# Patient Record
Sex: Female | Born: 1960
Health system: Southern US, Community
[De-identification: ages and names within clinical notes are randomized; demographics above are authoritative.]

## PROBLEM LIST (undated history)

## (undated) DIAGNOSIS — I10 Essential (primary) hypertension: Secondary | ICD-10-CM

## (undated) DIAGNOSIS — F419 Anxiety disorder, unspecified: Secondary | ICD-10-CM

## (undated) DIAGNOSIS — M542 Cervicalgia: Secondary | ICD-10-CM

## (undated) DIAGNOSIS — M5020 Other cervical disc displacement, unspecified cervical region: Secondary | ICD-10-CM

## (undated) HISTORY — PX: LYMPH NODE BIOPSY: SHX201

## (undated) HISTORY — PX: OTHER SURGICAL HISTORY: SHX169

## (undated) HISTORY — DX: Essential (primary) hypertension: I10

## (undated) HISTORY — DX: Cervicalgia: M54.2

## (undated) HISTORY — DX: Other cervical disc displacement, unspecified cervical region: M50.20

## (undated) HISTORY — DX: Anxiety disorder, unspecified: F41.9

---

## 2015-12-01 DIAGNOSIS — I10 Essential (primary) hypertension: Secondary | ICD-10-CM | POA: Insufficient documentation

## 2016-08-10 DIAGNOSIS — Z6821 Body mass index (BMI) 21.0-21.9, adult: Secondary | ICD-10-CM | POA: Diagnosis not present

## 2016-08-10 DIAGNOSIS — Z0000 Encounter for general adult medical examination without abnormal findings: Secondary | ICD-10-CM | POA: Diagnosis not present

## 2016-09-12 DIAGNOSIS — R87810 Cervical high risk human papillomavirus (HPV) DNA test positive: Secondary | ICD-10-CM | POA: Diagnosis not present

## 2017-06-30 DIAGNOSIS — R509 Fever, unspecified: Secondary | ICD-10-CM | POA: Diagnosis not present

## 2017-06-30 DIAGNOSIS — R05 Cough: Secondary | ICD-10-CM | POA: Diagnosis not present

## 2017-06-30 DIAGNOSIS — R6889 Other general symptoms and signs: Secondary | ICD-10-CM | POA: Diagnosis not present

## 2017-07-04 DIAGNOSIS — Z1231 Encounter for screening mammogram for malignant neoplasm of breast: Secondary | ICD-10-CM | POA: Diagnosis not present

## 2017-08-11 DIAGNOSIS — R928 Other abnormal and inconclusive findings on diagnostic imaging of breast: Secondary | ICD-10-CM | POA: Diagnosis not present

## 2017-08-11 DIAGNOSIS — N6489 Other specified disorders of breast: Secondary | ICD-10-CM | POA: Diagnosis not present

## 2017-09-27 DIAGNOSIS — H00021 Hordeolum internum right upper eyelid: Secondary | ICD-10-CM | POA: Diagnosis not present

## 2018-02-23 ENCOUNTER — Ambulatory Visit: Payer: Self-pay | Admitting: Adult Health

## 2018-03-09 ENCOUNTER — Encounter: Payer: Self-pay | Admitting: Adult Health

## 2018-03-09 ENCOUNTER — Ambulatory Visit: Payer: Federal, State, Local not specified - PPO | Admitting: Adult Health

## 2018-03-09 VITALS — BP 152/84 | HR 96 | Resp 16 | Ht 64.5 in | Wt 120.8 lb

## 2018-03-09 DIAGNOSIS — R0602 Shortness of breath: Secondary | ICD-10-CM | POA: Diagnosis not present

## 2018-03-09 DIAGNOSIS — I10 Essential (primary) hypertension: Secondary | ICD-10-CM | POA: Diagnosis not present

## 2018-03-09 DIAGNOSIS — F411 Generalized anxiety disorder: Secondary | ICD-10-CM | POA: Diagnosis not present

## 2018-03-09 NOTE — Progress Notes (Signed)
[facility] [address] [place], [place] [address]  Internal MEDICINE  Office Visit Note  Patient Name: Melody Hicks  [id number]  [id number]  Date of Service: 03/15/2018   Complaints/HPI Pt is here for establishment of PCP. Chief Complaint  Patient presents with  . Hypertension    NEW PATIENT ESTABLISHING CARE   . Anxiety   HPI Pt here to establish care.  She has a history of HTN and anxiety.  She has recently moved to the area.  She had a very bad upper resp infection earlier this year, and her previous doctor told her she may have copd.  She denies Sob, Cough, Doe, or any other symptoms now that her URI has resolved.  She takes amlodipine for her blood pressure, and an aspirin daily.  She had multiple life changes, including deaths in the last 10 months.  Due to this she was diagnosed with anxiety.  She has never taken medications for anxiety, and reports she is doing well now.    Current Medication: Outpatient Encounter Medications as of 03/09/2018  Medication Sig  . amLODipine (NORVASC) 10 MG tablet Take 10 mg by mouth daily.  Marland Kitchen aspirin 81 MG EC tablet Take by mouth.  . Black Currant Seed Oil 500 MG CAPS Take by mouth.  . Calcium-Magnesium-Vitamin D 600-40-500 MG-MG-UNIT TB24 Take by mouth.  . DIGESTIVE ENZYMES PO Take by mouth.  . Omega-3 Fatty Acids (FISH OIL) 1000 MG CAPS Take by mouth.   No facility-administered encounter medications on file as of 03/09/2018.    Surgical History: Past Surgical History:  Procedure Laterality Date  . NEVER     Medical History: Past Medical History:  Diagnosis Date  . Anxiety   . Hypertension    Family History: Family History  Problem Relation Age of Onset  . Diabetes Mother   . Stroke Mother   . Hypertension Mother   . Heart disease Mother   . Hypertension Father   . Stroke Father     Social History   Socioeconomic History  . Marital status: Single    Spouse name: Not on file  . Number of children: Not  on file  . Years of education: Not on file  . Highest education level: Not on file  Occupational History  . Not on file  Social Needs  . Financial resource strain: Not on file  . Food insecurity:    Worry: Not on file    Inability: Not on file  . Transportation needs:    Medical: Not on file    Non-medical: Not on file  Tobacco Use  . Smoking status: Former Games developer  . Smokeless tobacco: Never Used  Substance and Sexual Activity  . Alcohol use: Yes  . Drug use: Never  . Sexual activity: Not on file  Lifestyle  . Physical activity:    Days per week: Not on file    Minutes per session: Not on file  . Stress: Not on file  Relationships  . Social connections:    Talks on phone: Not on file    Gets together: Not on file    Attends religious service: Not on file    Active member of club or organization: Not on file    Attends meetings of clubs or organizations: Not on file    Relationship status: Not on file  . Intimate partner violence:    Fear of current or ex partner: Not on file    Emotionally abused: Not on file  Physically abused: Not on file    Forced sexual activity: Not on file  Other Topics Concern  . Not on file  Social History Narrative  . Not on file   Review of Systems  Constitutional: Negative for chills, fatigue and unexpected weight change.  HENT: Negative for congestion, rhinorrhea, sneezing and sore throat.   Eyes: Negative for photophobia, pain and redness.  Respiratory: Negative for cough, chest tightness and shortness of breath.   Cardiovascular: Negative for chest pain and palpitations.  Gastrointestinal: Negative for abdominal pain, constipation, diarrhea, nausea and vomiting.  Endocrine: Negative.   Genitourinary: Negative for dysuria and frequency.  Musculoskeletal: Negative for arthralgias, back pain, joint swelling and neck pain.  Skin: Negative for rash.  Allergic/Immunologic: Negative.   Neurological: Negative for tremors and numbness.   Hematological: Negative for adenopathy. Does not bruise/bleed easily.  Psychiatric/Behavioral: Negative for behavioral problems and sleep disturbance. The patient is not nervous/anxious.    Vital Signs: BP (!) 152/84   Pulse 96   Resp 16   Ht 5' 4.5" (1.638 m)   Wt 120 lb 12.8 oz (54.8 kg)   SpO2 98%   BMI 20.42 kg/m    Physical Exam  Constitutional: She is oriented to person, place, and time. She appears well-developed and well-nourished. No distress.  HENT:  Head: Normocephalic and atraumatic.  Mouth/Throat: Oropharynx is clear and moist. No oropharyngeal exudate.  Eyes: Pupils are equal, round, and reactive to light. EOM are normal.  Neck: Normal range of motion. Neck supple. No JVD present. No tracheal deviation present. No thyromegaly present.  Cardiovascular: Normal rate, regular rhythm and normal heart sounds. Exam reveals no gallop and no friction rub.  No murmur heard. Pulmonary/Chest: Effort normal and breath sounds normal. No respiratory distress. She has no wheezes. She has no rales. She exhibits no tenderness.  Abdominal: Soft. There is no tenderness. There is no guarding.  Musculoskeletal: Normal range of motion.  Lymphadenopathy:    She has no cervical adenopathy.  Neurological: She is alert and oriented to person, place, and time. No cranial nerve deficit.  Skin: Skin is warm and dry. She is not diaphoretic.  Psychiatric: She has a normal mood and affect. Her behavior is normal. Judgment and thought content normal.  Nursing note and vitals reviewed.  Assessment/Plan: 1. Hypertension, unspecified type Continue to take amlodipine as directed. BP is slightly elevated today Will increase meds prn   2. GAD (generalized anxiety disorder) On no medications, controlled at this time.   3. Shortness of breath - Pulmonary Function Test; Future  General Counseling: Rashell verbalizes understanding of the findings of todays visit and agrees with plan of treatment. I  have discussed any further diagnostic evaluation that may be needed or ordered today. We also reviewed her medications today. she has been encouraged to call the office with any questions or concerns that should arise related to todays visit.  Orders Placed This Encounter  Procedures  . Pulmonary Function Test   Time spent: 25 Minutes  This patient was seen by Blima LedgerAdam Mattia Liford AGNP-C in Collaboration with Dr Lyndon CodeFozia M Khan as a part of collaborative care agreement

## 2018-03-09 NOTE — Patient Instructions (Signed)

## 2018-03-21 ENCOUNTER — Ambulatory Visit: Payer: Federal, State, Local not specified - PPO | Admitting: Internal Medicine

## 2018-03-21 DIAGNOSIS — R0602 Shortness of breath: Secondary | ICD-10-CM

## 2018-03-21 LAB — PULMONARY FUNCTION TEST

## 2018-03-22 ENCOUNTER — Encounter: Payer: Self-pay | Admitting: Obstetrics & Gynecology

## 2018-03-22 ENCOUNTER — Other Ambulatory Visit: Payer: Self-pay

## 2018-03-22 ENCOUNTER — Ambulatory Visit (INDEPENDENT_AMBULATORY_CARE_PROVIDER_SITE_OTHER): Payer: Federal, State, Local not specified - PPO | Admitting: Obstetrics & Gynecology

## 2018-03-22 VITALS — BP 127/75 | HR 88 | Ht 64.0 in | Wt 121.0 lb

## 2018-03-22 DIAGNOSIS — Z113 Encounter for screening for infections with a predominantly sexual mode of transmission: Secondary | ICD-10-CM | POA: Diagnosis not present

## 2018-03-22 DIAGNOSIS — Z01419 Encounter for gynecological examination (general) (routine) without abnormal findings: Secondary | ICD-10-CM | POA: Diagnosis not present

## 2018-03-22 DIAGNOSIS — Z1151 Encounter for screening for human papillomavirus (HPV): Secondary | ICD-10-CM

## 2018-03-22 DIAGNOSIS — Z124 Encounter for screening for malignant neoplasm of cervix: Secondary | ICD-10-CM | POA: Diagnosis not present

## 2018-03-22 NOTE — Patient Instructions (Addendum)
Thank you for enrolling in [place]. Please follow the instructions below to securely access your online medical record. MyChart allows you to send messages to your doctor, view your test results, manage appointments, and more.   How Do I Sign Up? 1. In your Internet browser, go to [employer] and enter [email or web address]. 2. Click on the Sign Up Now link in the Sign In box. You will see the New Member Sign Up page. 3. Enter your MyChart Access Code exactly as it appears below. You will not need to use this code after you've completed the sign-up process. If you do not sign up before the expiration date, you must request a new code.  MyChart Access Code: [email or web address] Expires: 05/06/2018  3:27 PM  4. Enter your Social Security Number ([id number]) and Date of Birth (mm/dd/yyyy) as indicated and click Submit. You will be taken to the next sign-up page. 5. Create a MyChart ID. This will be your MyChart login ID and cannot be changed, so think of one that is secure and easy to remember. 6. Create a MyChart password. You can change your password at any time. 7. Enter your Password Reset Question and Answer. This can be used at a later time if you forget your password.  8. Enter your e-mail address. You will receive e-mail notification when new information is available in [place]. 9. Click Sign Up. You can now view your medical record.   Additional Information Remember, MyChart is NOT to be used for urgent needs. For medical emergencies, dial 911.     Preventive Care 40-64 Years, Female Preventive care refers to lifestyle choices and visits with your health care provider that can promote health and wellness. What does preventive care include?  A yearly physical exam. This is also called an annual well check.  Dental exams once or twice a year.  Routine eye exams. Ask your health care provider how often you should have your eyes checked.  Personal lifestyle choices,  including: ? Daily care of your teeth and gums. ? Regular physical activity. ? Eating a healthy diet. ? Avoiding tobacco and drug use. ? Limiting alcohol use. ? Practicing safe sex. ? Taking low-dose aspirin daily starting at age 71. ? Taking vitamin and mineral supplements as recommended by your health care provider. What happens during an annual well check? The services and screenings done by your health care provider during your annual well check will depend on your age, overall health, lifestyle risk factors, and family history of disease. Counseling Your health care provider may ask you questions about your:  Alcohol use.  Tobacco use.  Drug use.  Emotional well-being.  Home and relationship well-being.  Sexual activity.  Eating habits.  Work and work Statistician.  Method of birth control.  Menstrual cycle.  Pregnancy history.  Screening You may have the following tests or measurements:  Height, weight, and BMI.  Blood pressure.  Lipid and cholesterol levels. These may be checked every 5 years, or more frequently if you are over 2 years old.  Skin check.  Lung cancer screening. You may have this screening every year starting at age 45 if you have a 30-pack-year history of smoking and currently smoke or have quit within the past 15 years.  Fecal occult blood test (FOBT) of the stool. You may have this test every year starting at age 51.  Flexible sigmoidoscopy or colonoscopy. You may have a sigmoidoscopy every 5 years or a colonoscopy every 10 years starting at age  50.  Hepatitis C blood test.  Hepatitis B blood test.  Sexually transmitted disease (STD) testing.  Diabetes screening. This is done by checking your blood sugar (glucose) after you have not eaten for a while (fasting). You may have this done every 1-3 years.  Mammogram. This may be done every 1-2 years. Talk to your health care provider about when you should start having regular  mammograms. This may depend on whether you have a family history of breast cancer.  BRCA-related cancer screening. This may be done if you have a family history of breast, ovarian, tubal, or peritoneal cancers.  Pelvic exam and Pap test. This may be done every 3 years starting at age 72. Starting at age 53, this may be done every 5 years if you have a Pap test in combination with an HPV test.  Bone density scan. This is done to screen for osteoporosis. You may have this scan if you are at high risk for osteoporosis.  Discuss your test results, treatment options, and if necessary, the need for more tests with your health care provider. Vaccines Your health care provider may recommend certain vaccines, such as:  Influenza vaccine. This is recommended every year.  Tetanus, diphtheria, and acellular pertussis (Tdap, Td) vaccine. You may need a Td booster every 10 years.  Varicella vaccine. You may need this if you have not been vaccinated.  Zoster vaccine. You may need this after age 32.  Measles, mumps, and rubella (MMR) vaccine. You may need at least one dose of MMR if you were born in 1957 or later. You may also need a second dose.  Pneumococcal 13-valent conjugate (PCV13) vaccine. You may need this if you have certain conditions and were not previously vaccinated.  Pneumococcal polysaccharide (PPSV23) vaccine. You may need one or two doses if you smoke cigarettes or if you have certain conditions.  Meningococcal vaccine. You may need this if you have certain conditions.  Hepatitis A vaccine. You may need this if you have certain conditions or if you travel or work in places where you may be exposed to hepatitis A.  Hepatitis B vaccine. You may need this if you have certain conditions or if you travel or work in places where you may be exposed to hepatitis B.  Haemophilus influenzae type b (Hib) vaccine. You may need this if you have certain conditions.  Talk to your health care  provider about which screenings and vaccines you need and how often you need them. This information is not intended to replace advice given to you by your health care provider. Make sure you discuss any questions you have with your health care provider. Document Released: 07/03/2015 Document Revised: 02/24/2016 Document Reviewed: 04/07/2015 Elsevier Interactive Patient Education  [employer].

## 2018-03-22 NOTE — Progress Notes (Signed)
GYNECOLOGY ANNUAL PREVENTATIVE CARE ENCOUNTER NOTE  Subjective:   Melody Hicks is a 57 y.o. PMP  female here for a routine annual gynecologic exam.  Patient gets PCP care at [facility], had her pap there last year.  She just moved to the area and wanted to establish care.  No current gynecologic complaints.   Denies abnormal vaginal bleeding, discharge, pelvic pain, problems with intercourse or other gynecologic concerns.    Gynecologic History No LMP recorded. Patient is postmenopausal. Last Pap: 08/10/2016. Results were: normal with positive HPV (negative 16/18/45) Last mammogram: 08/04/2017. Results were: normal  Obstetric History OB History  Gravida Para Term Preterm AB Living  2 1 1   1 1   SAB TAB Ectopic Multiple Live Births  1       1    # Outcome Date GA Lbr Len/2nd Weight Sex Delivery Anes PTL Lv  2 SAB 2001          1 Term 45    M    LIV    Past Medical History:  Diagnosis Date  . Anxiety   . Hypertension   . Neck pain     Past Surgical History:  Procedure Laterality Date  . NEVER      Current Outpatient Medications on File Prior to Visit  Medication Sig Dispense Refill  . amLODipine (NORVASC) 10 MG tablet Take 10 mg by mouth daily.  3  . aspirin 81 MG EC tablet Take by mouth.    . Black Currant Seed Oil 500 MG CAPS Take by mouth.    . Calcium-Magnesium-Vitamin D 600-40-500 MG-MG-UNIT TB24 Take by mouth.    . DIGESTIVE ENZYMES PO Take by mouth.    . Omega-3 Fatty Acids (FISH OIL) 1000 MG CAPS Take by mouth.     No current facility-administered medications on file prior to visit.     Allergies  Allergen Reactions  . Latex Itching  . Lisinopril     Other reaction(s): Hypotension  . Iodinated Diagnostic Agents Nausea And Vomiting    Social History:  reports that she has quit smoking. She has never used smokeless tobacco. She reports that she drank alcohol. She reports that she does not use drugs.  Family History  Problem Relation Age of Onset   . Diabetes Mother   . Stroke Mother   . Hypertension Mother   . Heart disease Mother   . Hypertension Father   . Stroke Father     The following portions of the patient's history were reviewed and updated as appropriate: allergies, current medications, past family history, past medical history, past social history, past surgical history and problem list.  Review of Systems Pertinent items noted in HPI and remainder of comprehensive ROS otherwise negative.   Objective:  BP 127/75 (BP Location: Left Arm)   Pulse 88   Ht 5\' 4"  (1.626 m)   Wt 121 lb (54.9 kg)   BMI 20.77 kg/m  CONSTITUTIONAL: Well-developed, well-nourished female in no acute distress.  HENT:  Normocephalic, atraumatic, External right and left ear normal. Oropharynx is clear and moist EYES: Conjunctivae and EOM are normal. Pupils are equal, round, and reactive to light. No scleral icterus.  NECK: Normal range of motion, supple, no masses.  Normal thyroid.  SKIN: Skin is warm and dry. No rash noted. Not diaphoretic. No erythema. No pallor. MUSCULOSKELETAL: Normal range of motion. No tenderness.  No cyanosis, clubbing, or edema.  2+ distal pulses. NEUROLOGIC: Alert and oriented to person, place,  and time. Normal reflexes, muscle tone coordination. No cranial nerve deficit noted. PSYCHIATRIC: Normal mood and affect. Normal behavior. Normal judgment and thought content. CARDIOVASCULAR: Normal heart rate noted, regular rhythm RESPIRATORY: Clear to auscultation bilaterally. Effort and breath sounds normal, no problems with respiration noted. BREASTS: Symmetric in size. No masses, skin changes, nipple drainage, or lymphadenopathy. ABDOMEN: Soft, normal bowel sounds, no distention noted.  No tenderness, rebound or guarding.  PELVIC: Normal appearing external genitalia with mild-moderate atrophy; normal appearing vaginal mucosa and cervix.  No abnormal discharge noted.  Pap smear obtained, mild bleeding after pap.  Normal uterine  size, no other palpable masses, no uterine or adnexal tenderness.  Assessment and Plan:  Encounter for gynecological examination with Papanicolaou smear of cervix - Cytology - PAP - Hepatitis B surface antigen - Hepatitis C antibody - RPR - HIV Antibody (routine testing w rflx) Will follow up results of pap smear and manage accordingly. Mammogram is up to date Routine preventative health maintenance measures emphasized. Please refer to After Visit Summary for other counseling recommendations.    Jaynie Collins, MD, FACOG Obstetrician & Gynecologist, [facility] for [employer], [facility]

## 2018-03-23 LAB — CYTOLOGY - PAP
CHLAMYDIA, DNA PROBE: NEGATIVE
DIAGNOSIS: NEGATIVE
HPV: NOT DETECTED
Neisseria Gonorrhea: NEGATIVE
Trichomonas: NEGATIVE

## 2018-03-23 LAB — HEPATITIS B SURFACE ANTIGEN: Hepatitis B Surface Ag: NEGATIVE

## 2018-03-23 LAB — RPR: RPR Ser Ql: NONREACTIVE

## 2018-03-23 LAB — HIV ANTIBODY (ROUTINE TESTING W REFLEX): HIV Screen 4th Generation wRfx: NONREACTIVE

## 2018-03-23 LAB — HEPATITIS C ANTIBODY: Hep C Virus Ab: <0.1 s/co ratio (ref 0.0–0.9)

## 2018-03-28 ENCOUNTER — Encounter: Payer: Self-pay | Admitting: Radiology

## 2018-04-05 NOTE — Procedures (Signed)
[facility] [address] [place], [address]  DATE OF SERVICE: March 21, 2018  Complete Pulmonary Function Testing Interpretation:  FINDINGS:  The forced vital capacity is normal.  The FEV1 is 2.44 L which is within normal limits.  Postbronchodilator there is no significant change in FEV1.  The FEV1 FVC ratio is decreased.  Total lung capacity is normal.  Residual volume is within normal limits.  Residual volume total lung capacity ratio is increased.  The FRC is within normal limits.  DLCO is within normal limits.  IMPRESSION:  This pulmonary function study is within normal limits.  DLCO is normal.  Clinical correlation recommended  Yevonne Pax, MD [facility] Pulmonary Critical Care Medicine Sleep Medicine

## 2018-07-15 ENCOUNTER — Encounter: Payer: Self-pay | Admitting: Nurse Practitioner

## 2018-07-16 NOTE — Telephone Encounter (Signed)
Hey. This came to me for some reason.

## 2018-07-27 DIAGNOSIS — L700 Acne vulgaris: Secondary | ICD-10-CM | POA: Diagnosis not present

## 2018-07-27 DIAGNOSIS — L249 Irritant contact dermatitis, unspecified cause: Secondary | ICD-10-CM | POA: Diagnosis not present

## 2018-07-31 DIAGNOSIS — Z23 Encounter for immunization: Secondary | ICD-10-CM | POA: Diagnosis not present

## 2018-08-09 ENCOUNTER — Encounter: Payer: Self-pay | Admitting: Adult Health

## 2018-08-09 ENCOUNTER — Encounter: Payer: Self-pay | Admitting: Nurse Practitioner

## 2018-09-13 ENCOUNTER — Encounter: Payer: Self-pay | Admitting: Nurse Practitioner

## 2018-09-13 ENCOUNTER — Ambulatory Visit (INDEPENDENT_AMBULATORY_CARE_PROVIDER_SITE_OTHER): Payer: Federal, State, Local not specified - PPO | Admitting: Nurse Practitioner

## 2018-09-13 ENCOUNTER — Other Ambulatory Visit: Payer: Self-pay

## 2018-09-13 VITALS — BP 138/78 | HR 85 | Resp 16 | Ht 64.0 in | Wt 122.0 lb

## 2018-09-13 DIAGNOSIS — M25511 Pain in right shoulder: Secondary | ICD-10-CM | POA: Diagnosis not present

## 2018-09-13 DIAGNOSIS — Z0001 Encounter for general adult medical examination with abnormal findings: Secondary | ICD-10-CM

## 2018-09-13 DIAGNOSIS — Z1239 Encounter for other screening for malignant neoplasm of breast: Secondary | ICD-10-CM | POA: Diagnosis not present

## 2018-09-13 DIAGNOSIS — I10 Essential (primary) hypertension: Secondary | ICD-10-CM | POA: Diagnosis not present

## 2018-09-13 DIAGNOSIS — R300 Dysuria: Secondary | ICD-10-CM

## 2018-09-13 NOTE — Progress Notes (Signed)
[facility] [address] [place], [place] [address]  Internal MEDICINE  Office Visit Note  Patient Name: Melody Hicks  [id number]  [id number]  Date of Service: 09/24/2018   Pt is here for routine health maintenance examination   Chief Complaint  Patient presents with  . Annual Exam    pt had pulmonary test done on 10/2 and did not get the results. pt had HEP A shot in arm due to going to visit Melody Hicks, Melody Hicks, her arm has not been normal since, has had pain since taking the shot, pt is not having any relief when touching the flesh,been going on since Feb., pt wants to know if its possible if she can get tested for HEP A  . Hypertension  . Anxiety     Right shoulder pain. Started after getting hepatitis A vaccine on 07/31/2018. Hurts along lateral aspect of the humerus bone. Reduced ROM and strength. Cannot put right arm behind her back. Pulls and makes this motion impossible. Admits she does carry a heavy pocketbook. She knows this does not help with pain and inflammation. Her last pap smear was 03/2018 and was normal. She is due to have screening mammogram.    Current Medication: Outpatient Encounter Medications as of 09/13/2018  Medication Sig  . amLODipine (NORVASC) 10 MG tablet Take 10 mg by mouth daily.  Marland Kitchen aspirin 81 MG EC tablet Take by mouth.  . Black Currant Seed Oil 500 MG CAPS Take by mouth.  . Calcium-Magnesium-Vitamin D 600-40-500 MG-MG-UNIT TB24 Take by mouth.  . DIGESTIVE ENZYMES PO Take by mouth.  . Omega-3 Fatty Acids (FISH OIL) 1000 MG CAPS Take by mouth.   No facility-administered encounter medications on file as of 09/13/2018.     Surgical History: Past Surgical History:  Procedure Laterality Date  . NEVER      Medical History: Past Medical History:  Diagnosis Date  . Anxiety   . Hypertension   . Neck pain     Family History: Family History  Problem Relation Age of Onset  . Diabetes Mother   . Stroke Mother   . Hypertension  Mother   . Heart disease Mother   . Hypertension Father   . Stroke Father       Review of Systems  Constitutional: Negative for chills, fatigue and unexpected weight change.  HENT: Negative for congestion, postnasal drip, rhinorrhea, sneezing and sore throat.   Respiratory: Negative for cough, chest tightness, shortness of breath and wheezing.   Cardiovascular: Negative for chest pain and palpitations.  Gastrointestinal: Negative for abdominal pain, constipation, diarrhea, nausea and vomiting.  Endocrine: Negative for cold intolerance, polydipsia and polyuria.  Genitourinary: Negative for dysuria and frequency.  Musculoskeletal: Positive for arthralgias. Negative for back pain, joint swelling and neck pain.       Right shoulder pain with weakness and reduced ROM.   Skin: Negative for rash.  Neurological: Negative.  Negative for tremors and numbness.  Hematological: Negative for adenopathy. Does not bruise/bleed easily.  Psychiatric/Behavioral: Negative for behavioral problems (Depression), sleep disturbance and suicidal ideas. The patient is not nervous/anxious.      Today's Vitals   09/13/18 1545  BP: 138/78  Pulse: 85  Resp: 16  SpO2: 98%  Weight: 122 lb (55.3 kg)  Height:  (1.626 m)   Body mass index is 20.94 kg/m.  Physical Exam Vitals signs and nursing note reviewed.  Constitutional:      General: She is not in acute distress.  Appearance: Normal appearance. She is well-developed. She is not diaphoretic.  HENT:     Head: Normocephalic and atraumatic.     Mouth/Throat:     Pharynx: No oropharyngeal exudate.  Eyes:     Pupils: Pupils are equal, round, and reactive to light.  Neck:     Musculoskeletal: Normal range of motion and neck supple.     Thyroid: No thyromegaly.     Vascular: No JVD.     Trachea: No tracheal deviation.  Cardiovascular:     Rate and Rhythm: Normal rate and regular rhythm.     Heart sounds: Normal heart sounds. No murmur. No  friction rub. No gallop.   Pulmonary:     Effort: Pulmonary effort is normal. No respiratory distress.     Breath sounds: No wheezing or rales.  Chest:     Chest wall: No tenderness.     Breasts:        Right: Normal. No swelling, bleeding, inverted nipple, mass, nipple discharge, skin change or tenderness.        Left: Normal. No swelling, bleeding, inverted nipple, mass, nipple discharge, skin change or tenderness.  Abdominal:     General: Bowel sounds are normal.     Palpations: Abdomen is soft.  Musculoskeletal: Normal range of motion.     Comments: Moderate right shoulder pain. More significant with moderate palpation or right humerus. No crepitus or bony abnormalities are noted today. ROM and strength minimally diminished.   Lymphadenopathy:     Cervical: No cervical adenopathy.  Skin:    General: Skin is warm and dry.  Neurological:     Mental Status: She is alert and oriented to person, place, and time.     Cranial Nerves: No cranial nerve deficit.  Psychiatric:        Behavior: Behavior normal.        Thought Content: Thought content normal.        Judgment: Judgment normal.   Assessment/Plan: 1. Encounter for general adult medical examination with abnormal findings Annual health maintenance exam today. Routine, fasting labs ordered   2. Hypertension, unspecified type Stable. Continue bp medication as prescribed   3. Right shoulder pain, unspecified chronicity Will get x-ray of right shoulder for further evaluation. Refer to orthopedics as indicated.  - DG Shoulder Right; Future  4. Screening for breast cancer - MM 3D SCREEN BREAST BILATERAL; Future  5. Dysuria - UA/M w/rflx Culture, Routine  General Counseling: Melody Hicks verbalizes understanding of the findings of todays visit and agrees with plan of treatment. I have discussed any further diagnostic evaluation that may be needed or ordered today. We also reviewed her medications today. she has been encouraged to  call the office with any questions or concerns that should arise related to todays visit.    Counseling:  Hypertension Counseling:   The following hypertensive lifestyle modification were recommended and discussed:  1. Limiting alcohol intake to less than 1 oz/day of ethanol:(24 oz of beer or 8 oz of wine or 2 oz of 100-proof whiskey). 2. Take baby ASA 81 mg daily. 3. Importance of regular aerobic exercise and losing weight. 4. Reduce dietary saturated fat and cholesterol intake for overall cardiovascular health. 5. Maintaining adequate dietary potassium, calcium, and magnesium intake. 6. Regular monitoring of the blood pressure. 7. Reduce sodium intake to less than 100 mmol/day (less than 2.3 gm of sodium or less than 6 gm of sodium choride)   This patient was seen by Vincent Gros FNP Collaboration  with Dr Lyndon Code as a part of collaborative care agreement  Orders Placed This Encounter  Procedures  . Microscopic Examination  . Urine Culture, Reflex  . DG Shoulder Right  . MM 3D SCREEN BREAST BILATERAL  . UA/M w/rflx Culture, Routine      Time spent: 45 Minutes      Lyndon Code, MD  Internal Medicine

## 2018-09-14 ENCOUNTER — Other Ambulatory Visit: Payer: Self-pay | Admitting: Nurse Practitioner

## 2018-09-14 ENCOUNTER — Ambulatory Visit
Admission: RE | Admit: 2018-09-14 | Discharge: 2018-09-14 | Disposition: A | Discharge status: Home / Self Care | Payer: Federal, State, Local not specified - PPO | Source: Ambulatory Visit | Attending: Nurse Practitioner | Admitting: Nurse Practitioner

## 2018-09-14 ENCOUNTER — Other Ambulatory Visit: Payer: Self-pay

## 2018-09-14 DIAGNOSIS — E559 Vitamin D deficiency, unspecified: Secondary | ICD-10-CM | POA: Diagnosis not present

## 2018-09-14 DIAGNOSIS — M25511 Pain in right shoulder: Secondary | ICD-10-CM

## 2018-09-14 DIAGNOSIS — I10 Essential (primary) hypertension: Secondary | ICD-10-CM | POA: Diagnosis not present

## 2018-09-14 DIAGNOSIS — Z0001 Encounter for general adult medical examination with abnormal findings: Secondary | ICD-10-CM | POA: Diagnosis not present

## 2018-09-15 LAB — CBC
Hematocrit: 35 % (ref 34.0–46.6)
Hemoglobin: 12 g/dL (ref 11.1–15.9)
MCH: 26.8 pg (ref 26.6–33.0)
MCHC: 34.3 g/dL (ref 31.5–35.7)
MCV: 78 fL — ABNORMAL LOW (ref 79–97)
Platelets: 326 10*3/uL (ref 150–450)
RBC: 4.47 x10E6/uL (ref 3.77–5.28)
RDW: 13.2 % (ref 11.7–15.4)
WBC: 5.3 10*3/uL (ref 3.4–10.8)

## 2018-09-15 LAB — UA/M W/RFLX CULTURE, ROUTINE
Bilirubin, UA: NEGATIVE
GLUCOSE, UA: NEGATIVE
Ketones, UA: NEGATIVE
NITRITE UA: NEGATIVE
PH UA: 7 (ref 5.0–7.5)
PROTEIN UA: NEGATIVE
RBC, UA: NEGATIVE
SPEC GRAV UA: 1.014 (ref 1.005–1.030)
Urobilinogen, Ur: 0.2 mg/dL (ref 0.2–1.0)

## 2018-09-15 LAB — COMPREHENSIVE METABOLIC PANEL
ALBUMIN: 4.6 g/dL (ref 3.8–4.9)
ALT: 9 IU/L (ref 0–32)
AST: 13 IU/L (ref 0–40)
Albumin/Globulin Ratio: 1.5 (ref 1.2–2.2)
Alkaline Phosphatase: 57 IU/L (ref 39–117)
BUN/Creatinine Ratio: 25 (ref 12–28)
BUN: 17 mg/dL (ref 8–27)
Bilirubin Total: <0.2 mg/dL (ref 0.0–1.2)
CO2: 24 mmol/L (ref 20–29)
Calcium: 9.7 mg/dL (ref 8.7–10.3)
Chloride: 102 mmol/L (ref 96–106)
Creatinine, Ser: 0.67 mg/dL (ref 0.57–1.00)
GFR calc Af Amer: 110 mL/min/{1.73_m2} (ref >59)
GFR calc non Af Amer: 96 mL/min/{1.73_m2} (ref >59)
Globulin, Total: 3 g/dL (ref 1.5–4.5)
Glucose: 84 mg/dL (ref 65–99)
Potassium: 4.6 mmol/L (ref 3.5–5.2)
Sodium: 142 mmol/L (ref 134–144)
Total Protein: 7.6 g/dL (ref 6.0–8.5)

## 2018-09-15 LAB — MICROSCOPIC EXAMINATION
CASTS: NONE SEEN /LPF
RBC: NONE SEEN /hpf (ref 0–2)

## 2018-09-15 LAB — LIPID PANEL W/O CHOL/HDL RATIO
Cholesterol, Total: 161 mg/dL (ref 100–199)
HDL: 57 mg/dL (ref >39)
LDL Calculated: 96 mg/dL (ref 0–99)
Triglycerides: 41 mg/dL (ref 0–149)
VLDL Cholesterol Cal: 8 mg/dL (ref 5–40)

## 2018-09-15 LAB — HEPATITIS A ANTIBODY, TOTAL: hep A Total Ab: POSITIVE — AB

## 2018-09-15 LAB — T3: T3, Total: 146 ng/dL (ref 71–180)

## 2018-09-15 LAB — T4, FREE: Free T4: 1.01 ng/dL (ref 0.82–1.77)

## 2018-09-15 LAB — TSH: TSH: 1.59 u[IU]/mL (ref 0.450–4.500)

## 2018-09-15 LAB — URINE CULTURE, REFLEX

## 2018-09-15 LAB — VITAMIN D 25 HYDROXY (VIT D DEFICIENCY, FRACTURES): Vit D, 25-Hydroxy: 30.5 ng/mL (ref 30.0–100.0)

## 2018-09-17 ENCOUNTER — Telehealth: Payer: Self-pay

## 2018-09-17 NOTE — Telephone Encounter (Signed)
Pt advised xray for shoulder is normal we going setup for MRI for further evaluation and send message to beth for Mri setup

## 2018-09-17 NOTE — Telephone Encounter (Signed)
-----   Message from Carlean Jews, NP sent at 09/14/2018  3:45 PM EDT ----- Please let the patient know that shoulder x-ray is normal. I would like to have her set up for MRI for further evaluation. Thanks.

## 2018-09-17 NOTE — Telephone Encounter (Signed)
-----   Message from Heather E Boscia, NP sent at 09/14/2018  3:45 PM EDT ----- Please let the patient know that shoulder x-ray is normal. I would like to have her set up for MRI for further evaluation. Thanks. 

## 2018-09-20 ENCOUNTER — Encounter: Payer: Self-pay | Admitting: Nurse Practitioner

## 2018-09-21 ENCOUNTER — Other Ambulatory Visit: Payer: Self-pay | Admitting: Nurse Practitioner

## 2018-09-21 DIAGNOSIS — N390 Urinary tract infection, site not specified: Secondary | ICD-10-CM

## 2018-09-21 MED ORDER — NITROFURANTOIN MONOHYD MACRO 100 MG PO CAPS: 100.0000 mg | ORAL_CAPSULE | Freq: Two times a day (BID) | ORAL | 0 refills | Status: DC

## 2018-09-21 NOTE — Progress Notes (Signed)
Urine sample with bacteriuria. Added macrobid 100mg  bid for 5 days. Sent to [facility] [address].

## 2018-09-24 DIAGNOSIS — M25511 Pain in right shoulder: Secondary | ICD-10-CM

## 2018-09-24 DIAGNOSIS — R300 Dysuria: Secondary | ICD-10-CM

## 2018-09-24 DIAGNOSIS — Z1239 Encounter for other screening for malignant neoplasm of breast: Secondary | ICD-10-CM | POA: Insufficient documentation

## 2018-09-24 DIAGNOSIS — Z0001 Encounter for general adult medical examination with abnormal findings: Secondary | ICD-10-CM | POA: Insufficient documentation

## 2018-09-24 HISTORY — DX: Dysuria: R30.0

## 2018-09-24 HISTORY — DX: Pain in right shoulder: M25.511

## 2018-09-26 ENCOUNTER — Other Ambulatory Visit: Payer: Self-pay | Admitting: Nurse Practitioner

## 2018-09-26 DIAGNOSIS — G8929 Other chronic pain: Secondary | ICD-10-CM

## 2018-09-26 DIAGNOSIS — M25511 Pain in right shoulder: Principal | ICD-10-CM

## 2018-09-27 NOTE — Telephone Encounter (Signed)
Hey. Did you forward this to the front to make appointment for this?

## 2018-10-02 ENCOUNTER — Encounter: Payer: Self-pay | Admitting: Nurse Practitioner

## 2018-10-17 ENCOUNTER — Encounter: Payer: Self-pay | Admitting: Nurse Practitioner

## 2018-10-22 ENCOUNTER — Encounter: Payer: Self-pay | Admitting: Internal Medicine

## 2018-10-22 ENCOUNTER — Ambulatory Visit: Payer: Federal, State, Local not specified - PPO | Admitting: Adult Health

## 2018-10-25 ENCOUNTER — Ambulatory Visit
Admission: RE | Admit: 2018-10-25 | Discharge: 2018-10-25 | Disposition: A | Discharge status: Home / Self Care | Payer: Federal, State, Local not specified - PPO | Source: Ambulatory Visit | Attending: Nurse Practitioner | Admitting: Nurse Practitioner

## 2018-10-25 ENCOUNTER — Other Ambulatory Visit: Payer: Self-pay

## 2018-10-25 DIAGNOSIS — G8929 Other chronic pain: Secondary | ICD-10-CM | POA: Insufficient documentation

## 2018-10-25 DIAGNOSIS — M25511 Pain in right shoulder: Secondary | ICD-10-CM | POA: Insufficient documentation

## 2018-10-26 DIAGNOSIS — Z03818 Encounter for observation for suspected exposure to other biological agents ruled out: Secondary | ICD-10-CM | POA: Diagnosis not present

## 2018-10-29 ENCOUNTER — Encounter: Payer: Self-pay | Admitting: Nurse Practitioner

## 2018-10-29 ENCOUNTER — Ambulatory Visit: Payer: Federal, State, Local not specified - PPO

## 2018-10-29 ENCOUNTER — Ambulatory Visit: Payer: Federal, State, Local not specified - PPO | Admitting: Nurse Practitioner

## 2018-10-29 ENCOUNTER — Other Ambulatory Visit: Payer: Self-pay

## 2018-10-29 VITALS — Ht 64.0 in | Wt 120.0 lb

## 2018-10-29 DIAGNOSIS — M25511 Pain in right shoulder: Secondary | ICD-10-CM | POA: Diagnosis not present

## 2018-10-29 MED ORDER — METHYLPREDNISOLONE 4 MG PO TBPK: ORAL_TABLET | ORAL | 0 refills | Status: DC

## 2018-10-29 NOTE — Progress Notes (Signed)
[facility] [address] [place], [place] [address]  Internal MEDICINE  Telephone Visit  Patient Name: Melody Hicks  [id number]  [id number]  Date of Service: 10/29/2018  I connected with the patient at 9:33am by webcam and verified the patients identity using two identifiers.   I discussed the limitations, risks, security and privacy concerns of performing an evaluation and management service by webcam and the availability of in person appointments. I also discussed with the patient that there may be a patient responsible charge related to the service.  The patient expressed understanding and agrees to proceed.    Chief Complaint  Patient presents with  . Telephone Screen    VIDEO VISIT  . Telephone Assessment  . Labs Only    MRI Results    The patient has been contacted via webcam for follow up visit due to concerns for spread of novel coronavirus. continues to have right shoulder pain. Started after getting hepatitis A vaccine on 07/31/2018. Hurts along lateral aspect of the humerus bone. Reduced ROM and strength. Cannot put right arm behind her back. Pulls and makes this motion impossible. Admits she does carry a heavy pocketbook. She knows this does not help with pain and inflammation. Taking ibuprofen for pain does help. ROM of the right arm is still limited due to pain and stiffness. She had MRI of the right shoulder done since her last visit. Shows evidence of capsulitis/bursitis of the shoulder, also has some thinning of the cartilage of the glenohumeral joint.       Current Medication: Outpatient Encounter Medications as of 10/29/2018  Medication Sig  . amLODipine (NORVASC) 10 MG tablet Take 10 mg by mouth daily.  Marland Kitchen aspirin 81 MG EC tablet Take by mouth.  . Black Currant Seed Oil 500 MG CAPS Take by mouth.  . Calcium-Magnesium-Vitamin D 600-40-500 MG-MG-UNIT TB24 Take by mouth.  . DIGESTIVE ENZYMES PO Take by mouth.  . methylPREDNISolone (MEDROL) 4 MG TBPK  tablet Take by mouth as directed for 6 days  . Omega-3 Fatty Acids (FISH OIL) 1000 MG CAPS Take by mouth.  . [DISCONTINUED] nitrofurantoin, macrocrystal-monohydrate, (MACROBID) 100 MG capsule Take 1 capsule (100 mg total) by mouth 2 (two) times daily. (Patient not taking: Reported on 10/29/2018)   No facility-administered encounter medications on file as of 10/29/2018.     Surgical History: Past Surgical History:  Procedure Laterality Date  . NEVER      Medical History: Past Medical History:  Diagnosis Date  . Anxiety   . Hypertension   . Neck pain     Family History: Family History  Problem Relation Age of Onset  . Diabetes Mother   . Stroke Mother   . Hypertension Mother   . Heart disease Mother   . Hypertension Father   . Stroke Father     Social History   Socioeconomic History  . Marital status: Single    Spouse name: Not on file  . Number of children: Not on file  . Years of education: Not on file  . Highest education level: Not on file  Occupational History  . Not on file  Social Needs  . Financial resource strain: Not on file  . Food insecurity:    Worry: Not on file    Inability: Not on file  . Transportation needs:    Medical: Not on file    Non-medical: Not on file  Tobacco Use  . Smoking status: Former Games developer  . Smokeless tobacco: Never  Used  Substance and Sexual Activity  . Alcohol use: Not Currently  . Drug use: Never  . Sexual activity: Not Currently  Lifestyle  . Physical activity:    Days per week: Not on file    Minutes per session: Not on file  . Stress: Not on file  Relationships  . Social connections:    Talks on phone: Not on file    Gets together: Not on file    Attends religious service: Not on file    Active member of club or organization: Not on file    Attends meetings of clubs or organizations: Not on file    Relationship status: Not on file  . Intimate partner violence:    Fear of current or ex partner: Not on file     Emotionally abused: Not on file    Physically abused: Not on file    Forced sexual activity: Not on file  Other Topics Concern  . Not on file  Social History Narrative  . Not on file      Review of Systems  Constitutional: Negative for chills, fatigue and unexpected weight change.  HENT: Negative for congestion, postnasal drip, rhinorrhea, sneezing and sore throat.   Respiratory: Negative for cough, chest tightness, shortness of breath and wheezing.   Cardiovascular: Negative for chest pain and palpitations.  Gastrointestinal: Negative for abdominal pain, constipation, diarrhea, nausea and vomiting.  Endocrine: Negative for cold intolerance, polydipsia and polyuria.  Genitourinary: Negative for dysuria and frequency.  Musculoskeletal: Positive for arthralgias. Negative for back pain, joint swelling and neck pain.       Right shoulder pain with weakness and reduced ROM.   Skin: Negative for rash.  Neurological: Negative for dizziness, tremors, numbness and headaches.  Hematological: Negative for adenopathy. Does not bruise/bleed easily.  Psychiatric/Behavioral: Negative for behavioral problems (Depression), sleep disturbance and suicidal ideas. The patient is not nervous/anxious.     Today's Vitals   10/29/18 0916  Weight: 120 lb (54.4 kg)  Height: 5\' 4"  (1.626 m)   Body mass index is 20.6 kg/m.  Observation/Objective:   The patient is alert and oriented. She is pleasant and answers all questions appropriately. Breathing is non-labored. She is in no acute distress at this time.    Assessment/Plan: 1. Right shoulder pain, unspecified chronicity Reviewed results of MRI with patient. Shows evidence of capsulitis/bursitis of right shoulder. Will try medrol taper. Take as directed for 6 days. Continue ibuprofen as needed and as indicated to reduce pain/inflammation. Refer to orthopedics as indicated . - methylPREDNISolone (MEDROL) 4 MG TBPK tablet; Take by mouth as directed for  6 days  Dispense: 21 tablet; Refill: 0  General Counseling: Kiearra verbalizes understanding of the findings of today's phone visit and agrees with plan of treatment. I have discussed any further diagnostic evaluation that may be needed or ordered today. We also reviewed her medications today. she has been encouraged to call the office with any questions or concerns that should arise related to todays visit.  This patient was seen by Vincent GrosHeather Nathania Waldman FNP Collaboration with Dr Lyndon CodeFozia M Khan as a part of collaborative care agreement  Meds ordered this encounter  Medications  . methylPREDNISolone (MEDROL) 4 MG TBPK tablet    Sig: Take by mouth as directed for 6 days    Dispense:  21 tablet    Refill:  0    Order Specific Question:   Supervising Provider    Answer:   Lyndon CodeKHAN, FOZIA M [[id number]]  Time spent: 37 Minutes    Dr Lavera Guise Internal medicine

## 2018-10-31 ENCOUNTER — Encounter: Payer: Self-pay | Admitting: Internal Medicine

## 2018-10-31 NOTE — Telephone Encounter (Signed)
How do we upload her PFT and results note onto mychart for her?

## 2018-11-01 ENCOUNTER — Telehealth: Payer: Self-pay

## 2018-11-01 NOTE — Telephone Encounter (Signed)
Spoke with pt that we cannot load pft on mychart we can hand her result

## 2018-11-29 ENCOUNTER — Ambulatory Visit
Admission: RE | Admit: 2018-11-29 | Discharge: 2018-11-29 | Disposition: A | Discharge status: Home / Self Care | Payer: Federal, State, Local not specified - PPO | Source: Ambulatory Visit | Attending: Nurse Practitioner | Admitting: Nurse Practitioner

## 2018-11-29 ENCOUNTER — Other Ambulatory Visit: Payer: Self-pay

## 2018-11-29 DIAGNOSIS — Z1231 Encounter for screening mammogram for malignant neoplasm of breast: Secondary | ICD-10-CM | POA: Diagnosis not present

## 2018-11-29 DIAGNOSIS — Z1239 Encounter for other screening for malignant neoplasm of breast: Secondary | ICD-10-CM

## 2018-12-12 ENCOUNTER — Encounter: Payer: Self-pay | Admitting: Nurse Practitioner

## 2019-01-29 ENCOUNTER — Encounter: Payer: Self-pay | Admitting: Radiology

## 2019-02-05 ENCOUNTER — Other Ambulatory Visit: Payer: Self-pay

## 2019-02-05 MED ORDER — AMLODIPINE BESYLATE 10 MG PO TABS: 10.0000 mg | ORAL_TABLET | Freq: Every day | ORAL | 3 refills | Status: DC

## 2019-03-21 ENCOUNTER — Ambulatory Visit: Payer: Federal, State, Local not specified - PPO | Admitting: Nurse Practitioner

## 2019-04-04 ENCOUNTER — Encounter: Payer: Self-pay | Admitting: Obstetrics & Gynecology

## 2019-04-04 ENCOUNTER — Ambulatory Visit (INDEPENDENT_AMBULATORY_CARE_PROVIDER_SITE_OTHER): Payer: Federal, State, Local not specified - PPO | Admitting: Obstetrics & Gynecology

## 2019-04-04 ENCOUNTER — Other Ambulatory Visit: Payer: Self-pay

## 2019-04-04 VITALS — BP 125/72 | HR 89 | Ht 64.0 in | Wt 124.0 lb

## 2019-04-04 DIAGNOSIS — Z1151 Encounter for screening for human papillomavirus (HPV): Secondary | ICD-10-CM

## 2019-04-04 DIAGNOSIS — Z01419 Encounter for gynecological examination (general) (routine) without abnormal findings: Secondary | ICD-10-CM

## 2019-04-04 DIAGNOSIS — Z113 Encounter for screening for infections with a predominantly sexual mode of transmission: Secondary | ICD-10-CM | POA: Diagnosis not present

## 2019-04-04 NOTE — Patient Instructions (Signed)

## 2019-04-04 NOTE — Progress Notes (Signed)
GYNECOLOGY ANNUAL PREVENTATIVE CARE ENCOUNTER NOTE  History:     Melody Hicks is a 58 y.o. G25P1011 female here for a routine annual gynecologic exam.  Current complaints: none.   Denies abnormal vaginal bleeding, discharge, pelvic pain, problems with intercourse or other gynecologic concerns.    Gynecologic History No LMP recorded. Patient is postmenopausal. Contraception: post menopausal status Last Pap: 03/22/2018. Results were: normal with negative HPV Last mammogram: 11/28/2017. Results were: normal  Obstetric History OB History  Gravida Para Term Preterm AB Living  2 1 1   1 1   SAB TAB Ectopic Multiple Live Births  1       1    # Outcome Date GA Lbr Len/2nd Weight Sex Delivery Anes PTL Lv  2 SAB 2001          1 Term 71    M    LIV    Past Medical History:  Diagnosis Date  . Anxiety   . Hypertension   . Neck pain     Past Surgical History:  Procedure Laterality Date  . NEVER      Current Outpatient Medications on File Prior to Visit  Medication Sig Dispense Refill  . amLODipine (NORVASC) 10 MG tablet Take 1 tablet (10 mg total) by mouth daily. 30 tablet 3  . aspirin 81 MG EC tablet Take by mouth.    . Calcium-Magnesium-Vitamin D 355-73-220 MG-MG-UNIT TB24 Take by mouth.    . Omega-3 Fatty Acids (FISH OIL) 1000 MG CAPS Take by mouth.    . Black Currant Seed Oil 500 MG CAPS Take by mouth.    . DIGESTIVE ENZYMES PO Take by mouth.    . methylPREDNISolone (MEDROL) 4 MG TBPK tablet Take by mouth as directed for 6 days (Patient not taking: Reported on 04/04/2019) 21 tablet 0   No current facility-administered medications on file prior to visit.     Allergies  Allergen Reactions  . Latex Itching  . Lisinopril Other (See Comments)    Other reaction(s): Hypotension Low bp,   . Iodinated Diagnostic Agents Nausea And Vomiting    Social History:  reports that she has quit smoking. She has never used smokeless tobacco. She reports previous alcohol use. She  reports that she does not use drugs.  Family History  Problem Relation Age of Onset  . Diabetes Mother   . Stroke Mother   . Hypertension Mother   . Heart disease Mother   . Hypertension Father   . Stroke Father     The following portions of the patient's history were reviewed and updated as appropriate: allergies, current medications, past family history, past medical history, past social history, past surgical history and problem list.  Review of Systems Pertinent items noted in HPI and remainder of comprehensive ROS otherwise negative.  Physical Exam:  BP 125/72   Pulse 89   Ht 5\' 4"  (1.626 m)   Wt 124 lb (56.2 kg)   BMI 21.28 kg/m  CONSTITUTIONAL: Well-developed, well-nourished female in no acute distress.  HENT:  Normocephalic, atraumatic, External right and left ear normal. Oropharynx is clear and moist EYES: Conjunctivae and EOM are normal. Pupils are equal, round, and reactive to light. No scleral icterus.  NECK: Normal range of motion, supple, no masses.  Normal thyroid.  SKIN: Skin is warm and dry. No rash noted. Not diaphoretic. No erythema. No pallor. MUSCULOSKELETAL: Normal range of motion. No tenderness.  No cyanosis, clubbing, or edema.  2+ distal pulses. NEUROLOGIC:  Alert and oriented to person, place, and time. Normal reflexes, muscle tone coordination. No cranial nerve deficit noted. PSYCHIATRIC: Normal mood and affect. Normal behavior. Normal judgment and thought content. CARDIOVASCULAR: Normal heart rate noted, regular rhythm RESPIRATORY: Clear to auscultation bilaterally. Effort and breath sounds normal, no problems with respiration noted. BREASTS: Symmetric in size. No masses, skin changes, nipple drainage, or lymphadenopathy. ABDOMEN: Soft, normal bowel sounds, no distention noted.  No tenderness, rebound or guarding.  PELVIC: Normal appearing external genitalia with mild-moderate atrophy; normal appearing vaginal mucosa and cervix.  No abnormal discharge  noted.  Pap smear obtained, mild bleeding after pap.  Normal uterine size, no other palpable masses, no uterine or adnexal tenderness.    Assessment and Plan:      1. Well woman exam with routine gynecological exam - Ambulatory referral to Family Practice - Cytology - PAP - Hepatitis B surface antigen - Hepatitis C antibody - HIV Antibody (routine testing w rflx) - RPR Will follow up results of pap smear and manage accordingly. Mammogram is up to date Routine preventative health maintenance measures emphasized, referred to PCP. Please refer to After Visit Summary for other counseling recommendations.    Jaynie Collins, MD, FACOG Obstetrician & Gynecologist, [facility] for [employer], [facility]

## 2019-04-05 LAB — HEPATITIS C ANTIBODY: Hep C Virus Ab: <0.1 s/co ratio (ref 0.0–0.9)

## 2019-04-05 LAB — HEPATITIS B SURFACE ANTIGEN: Hepatitis B Surface Ag: NEGATIVE

## 2019-04-05 LAB — RPR: RPR Ser Ql: NONREACTIVE

## 2019-04-05 LAB — HIV ANTIBODY (ROUTINE TESTING W REFLEX): HIV Screen 4th Generation wRfx: NONREACTIVE

## 2019-04-08 ENCOUNTER — Ambulatory Visit: Payer: Federal, State, Local not specified - PPO | Admitting: Nurse Practitioner

## 2019-04-11 LAB — CYTOLOGY - PAP
Chlamydia: NEGATIVE
Comment: NEGATIVE
Comment: NEGATIVE
Comment: NEGATIVE
Comment: NORMAL
Diagnosis: NEGATIVE
High risk HPV: NEGATIVE
Neisseria Gonorrhea: NEGATIVE
Trichomonas: NEGATIVE

## 2019-04-12 ENCOUNTER — Ambulatory Visit: Payer: Federal, State, Local not specified - PPO | Admitting: Family Medicine

## 2019-06-28 ENCOUNTER — Ambulatory Visit (INDEPENDENT_AMBULATORY_CARE_PROVIDER_SITE_OTHER): Payer: Federal, State, Local not specified - PPO | Admitting: Primary Care

## 2019-06-28 ENCOUNTER — Other Ambulatory Visit: Payer: Self-pay

## 2019-06-28 ENCOUNTER — Encounter: Payer: Self-pay | Admitting: Primary Care

## 2019-06-28 DIAGNOSIS — I10 Essential (primary) hypertension: Secondary | ICD-10-CM

## 2019-06-28 MED ORDER — AMLODIPINE BESYLATE 10 MG PO TABS: 10.0000 mg | ORAL_TABLET | Freq: Every day | ORAL | 3 refills | Status: DC

## 2019-06-28 NOTE — Assessment & Plan Note (Signed)
Stable in the office today off of Amlodipine for one month. She has improved her diet, mostly vegan diet now.  Will have her monitor BP at home off of Amlodipine for now and resume Amlodipine if BP increases at or above 130/90. Refill provided. She will update.

## 2019-06-28 NOTE — Patient Instructions (Signed)
Continue to monitor your blood pressure off of Amlodipine. Resume your blood pressure medication if your blood pressure becomes consistently elevated at or above 135/90.  Please schedule a physical with me in April 2021. You may also schedule a lab only appointment 3-4 days prior. We will discuss your lab results in detail during your physical.  It was a pleasure to meet you today! Please don't hesitate to call or message me with any questions. Welcome to [facility]!

## 2019-06-28 NOTE — Progress Notes (Signed)
Subjective:    Patient ID: Melody Hicks, female    DOB: Dec 13, 1960, 59 y.o.   MRN: [id number]  HPI  This visit occurred during the SARS-CoV-2 public health emergency.  Safety protocols were in place, including screening questions prior to the visit, additional usage of staff PPE, and extensive cleaning of exam room while observing appropriate contact time as indicated for disinfecting solutions.   Melody Hicks is a 59 year old female who presents today to establish care and discuss the problems mentioned below. Will obtain/review records.  1) Essential Hypertension: Currently managed on amlodipine 10 mg. She has been out of her Amlodipine for the last one week. She checked her blood pressure last night which was 146/84 without resting. She is needing a refill today.  She denies chest pain, headaches, dizziness, blurred vision.   BP Readings from Last 3 Encounters:  06/28/19 126/82  04/04/19 125/72  09/13/18 138/78     Review of Systems  Eyes: Negative for visual disturbance.  Respiratory: Negative for shortness of breath.   Cardiovascular: Negative for chest pain.  Neurological: Negative for dizziness and headaches.       Past Medical History:  Diagnosis Date  . Anxiety   . Herniated disc, cervical   . Hypertension      Social History   Socioeconomic History  . Marital status: Single    Spouse name: Not on file  . Number of children: Not on file  . Years of education: Not on file  . Highest education level: Not on file  Occupational History  . Not on file  Tobacco Use  . Smoking status: Former Games developer  . Smokeless tobacco: Never Used  Substance and Sexual Activity  . Alcohol use: Not Currently  . Drug use: Never  . Sexual activity: Not Currently  Other Topics Concern  . Not on file  Social History Narrative   Single.   1 son. 1 grandchild.   Working part time.   Social Determinants of Health   Financial Resource Strain:   . Difficulty of Paying Living  Expenses: Not on file  Food Insecurity:   . Worried About Programme researcher, broadcasting/film/video in the Last Year: Not on file  . Ran Out of Food in the Last Year: Not on file  Transportation Needs:   . Lack of Transportation (Medical): Not on file  . Lack of Transportation (Non-Medical): Not on file  Physical Activity:   . Days of Exercise per Week: Not on file  . Minutes of Exercise per Session: Not on file  Stress:   . Feeling of Stress : Not on file  Social Connections:   . Frequency of Communication with Friends and Family: Not on file  . Frequency of Social Gatherings with Friends and Family: Not on file  . Attends Religious Services: Not on file  . Active Member of Clubs or Organizations: Not on file  . Attends Banker Meetings: Not on file  . Marital Status: Not on file  Intimate Partner Violence:   . Fear of Current or Ex-Partner: Not on file  . Emotionally Abused: Not on file  . Physically Abused: Not on file  . Sexually Abused: Not on file    Past Surgical History:  Procedure Laterality Date  . LYMPH NODE BIOPSY      Family History  Problem Relation Age of Onset  . Diabetes Mother   . Stroke Mother   . Hypertension Mother   . Heart disease Mother   .  Hypertension Father   . Stroke Father     Allergies  Allergen Reactions  . Latex Itching  . Lisinopril Other (See Comments)    Other reaction(s): Hypotension Low bp,   . Iodinated Diagnostic Agents Nausea And Vomiting    Current Outpatient Medications on File Prior to Visit  Medication Sig Dispense Refill  . amLODipine (NORVASC) 10 MG tablet Take 1 tablet (10 mg total) by mouth daily. 30 tablet 3  . ascorbic acid (VITAMIN C) 500 MG tablet Take by mouth.    Marland Kitchen aspirin 81 MG EC tablet Take by mouth.    . Black Currant Seed Oil 500 MG CAPS Take by mouth.    . Calcium-Magnesium-Vitamin D [id number] MG-MG-UNIT TB24 Take by mouth.    . DIGESTIVE ENZYMES PO Take by mouth.    . Omega-3 Fatty Acids (FISH OIL) 1000 MG  CAPS Take by mouth.     No current facility-administered medications on file prior to visit.    BP 126/82   Pulse 88   Temp 97.6 F (36.4 C) (Temporal)   Ht 5' 4.75" (1.645 m)   Wt 123 lb 12 oz (56.1 kg)   SpO2 98%   BMI 20.75 kg/m    Objective:   Physical Exam  Constitutional: She appears well-nourished.  Cardiovascular: Normal rate and regular rhythm.  Respiratory: Effort normal and breath sounds normal.  Musculoskeletal:     Cervical back: Neck supple.  Skin: Skin is warm and dry.  Psychiatric: She has a normal mood and affect.           Assessment & Plan:

## 2019-07-14 DIAGNOSIS — Z1152 Encounter for screening for COVID-19: Secondary | ICD-10-CM

## 2019-07-14 DIAGNOSIS — I10 Essential (primary) hypertension: Secondary | ICD-10-CM

## 2019-08-05 ENCOUNTER — Emergency Department
Admission: EM | Admit: 2019-08-05 | Discharge: 2019-08-05 | Disposition: A | Discharge status: Home / Self Care | Payer: Federal, State, Local not specified - PPO | Attending: Emergency Medicine | Admitting: Emergency Medicine

## 2019-08-05 ENCOUNTER — Telehealth: Payer: Self-pay

## 2019-08-05 ENCOUNTER — Other Ambulatory Visit: Payer: Self-pay

## 2019-08-05 DIAGNOSIS — I10 Essential (primary) hypertension: Secondary | ICD-10-CM | POA: Insufficient documentation

## 2019-08-05 DIAGNOSIS — Z91040 Latex allergy status: Secondary | ICD-10-CM | POA: Diagnosis not present

## 2019-08-05 DIAGNOSIS — Z87891 Personal history of nicotine dependence: Secondary | ICD-10-CM | POA: Diagnosis not present

## 2019-08-05 DIAGNOSIS — Z79899 Other long term (current) drug therapy: Secondary | ICD-10-CM | POA: Diagnosis not present

## 2019-08-05 DIAGNOSIS — R220 Localized swelling, mass and lump, head: Secondary | ICD-10-CM | POA: Diagnosis not present

## 2019-08-05 LAB — BASIC METABOLIC PANEL
Anion gap: 8 (ref 5–15)
BUN: 16 mg/dL (ref 6–20)
CO2: 27 mmol/L (ref 22–32)
Calcium: 9.1 mg/dL (ref 8.9–10.3)
Chloride: 105 mmol/L (ref 98–111)
Creatinine, Ser: 0.61 mg/dL (ref 0.44–1.00)
GFR calc Af Amer: >60 mL/min (ref >60)
GFR calc non Af Amer: >60 mL/min (ref >60)
Glucose, Bld: 108 mg/dL — ABNORMAL HIGH (ref 70–99)
Potassium: 3.6 mmol/L (ref 3.5–5.1)
Sodium: 140 mmol/L (ref 135–145)

## 2019-08-05 LAB — CBC
HCT: 35.9 % — ABNORMAL LOW (ref 36.0–46.0)
Hemoglobin: 11.2 g/dL — ABNORMAL LOW (ref 12.0–15.0)
MCH: 26.9 pg (ref 26.0–34.0)
MCHC: 31.2 g/dL (ref 30.0–36.0)
MCV: 86.3 fL (ref 80.0–100.0)
Platelets: 312 10*3/uL (ref 150–400)
RBC: 4.16 MIL/uL (ref 3.87–5.11)
RDW: 13.9 % (ref 11.5–15.5)
WBC: 6.1 10*3/uL (ref 4.0–10.5)
nRBC: 0 % (ref 0.0–0.2)

## 2019-08-05 NOTE — ED Provider Notes (Addendum)
[facility] Emergency Department Provider Note  Time seen: 2:59 AM  I have reviewed the triage vital signs and the nursing notes.   HISTORY  Chief Complaint Hypertension and Facial Swelling   HPI Melody Hicks is a 59 y.o. female with a past medical history of anxiety, hypertension presents to the emergency department for right facial swelling and hypertension.  According to the patient she stated earlier tonight she noticed that it looked like the right side of her face is swollen a little, she states she took her blood pressure and it was over 200.  Patient states she took it for more times and remained elevated so she came to the emergency department.  Patient has a history of hypertension is prescribed amlodipine but states she has not been taking it over the past month or so because her blood pressures have largely been normal at home.  Patient states she checked it last week and it was around 846 systolic.  Patient denies any chest pain no shortness of breath cough or fever.  Largely negative review of systems.  Denies any dental pain or facial pain.   Past Medical History:  Diagnosis Date  . Anxiety   . Herniated disc, cervical   . Hypertension      Past Surgical History:  Procedure Laterality Date  . LYMPH NODE BIOPSY      Prior to Admission medications   Medication Sig Start Date End Date Taking? Authorizing Provider  amLODipine (NORVASC) 10 MG tablet Take 1 tablet (10 mg total) by mouth daily. For blood pressure. 06/28/19   Pleas Koch, NP  ascorbic acid (VITAMIN C) 500 MG tablet Take by mouth.    [provider]  aspirin 81 MG EC tablet Take by mouth.    [provider]  Black Currant Seed Oil 500 MG CAPS Take by mouth.    [provider]  Calcium-Magnesium-Vitamin D [id number] MG-MG-UNIT TB24 Take by mouth.    [provider]  DIGESTIVE ENZYMES PO Take by mouth.    [provider]  Omega-3  Fatty Acids (FISH OIL) 1000 MG CAPS Take by mouth.    [provider]    Allergies  Allergen Reactions  . Latex Itching  . Lisinopril Other (See Comments)    Other reaction(s): Hypotension Low bp,   . Iodinated Diagnostic Agents Nausea And Vomiting    Family History  Problem Relation Age of Onset  . Diabetes Mother   . Stroke Mother   . Hypertension Mother   . Heart disease Mother   . Hypertension Father   . Stroke Father     Social History Social History   Tobacco Use  . Smoking status: Former Research scientist (life sciences)  . Smokeless tobacco: Never Used  Substance Use Topics  . Alcohol use: Not Currently  . Drug use: Never    Review of Systems Constitutional: Negative for fever. ENT: States right facial swelling Cardiovascular: Negative for chest pain. Respiratory: Negative for shortness of breath. Gastrointestinal: Negative for abdominal pain Musculoskeletal: Negative for musculoskeletal complaints Neurological: Negative for headache All other ROS negative  ____________________________________________   PHYSICAL EXAM:  VITAL SIGNS: ED Triage Vitals  Enc Vitals Group     BP 08/05/19 0228 (!) 186/93     Pulse Rate 08/05/19 0228 (!) 109     Resp 08/05/19 0228 18     Temp 08/05/19 0228 98.5 F (36.9 C)     Temp Source 08/05/19 0228 Oral  SpO2 08/05/19 0228 100 %     Weight 08/05/19 0229 124 lb (56.2 kg)     Height 08/05/19 0229 5\' 4"  (1.626 m)     Head Circumference --      Peak Flow --      Pain Score --      Pain Loc --      Pain Edu? --      Excl. in GC? --    Constitutional: Alert and oriented. Well appearing and in no distress. Eyes: Normal exam ENT      Head: Normocephalic and atraumatic.  No swelling noted.      Mouth/Throat: Mucous membranes are moist.  Normal exam without swelling identified.  No dental pain identified. Cardiovascular: Normal rate, regular rhythm. No murmur Respiratory: Normal respiratory effort without tachypnea nor retractions.  Breath sounds are clear  Gastrointestinal: Soft and nontender. No distention.   Musculoskeletal: Nontender with normal range of motion in all extremities. Neurologic:  Normal speech and language. No gross focal neurologic deficits  Skin:  Skin is warm, dry and intact.  Psychiatric: Mood and affect are normal.   INITIAL IMPRESSION / ASSESSMENT AND PLAN / ED COURSE  Pertinent labs & imaging results that were available during my care of the patient were reviewed by me and considered in my medical decision making (see chart for details).   Patient presents emergency department for hypertension and facial swelling earlier.  No appreciable facial swelling at this time.  Patient's blood pressure is 186/93, rechecked in the room was 195 systolic.  Patient states she did take 10 mg of amlodipine just prior to arrival.  We will check basic labs, EKG and continue to closely monitor.  Overall the patient appears well, no distress reassuring physical exam.  Lab work is largely nonrevealing. EKG viewed and interpreted by myself shows sinus tachycardia 106 bpm, narrow QRS, normal axis, normal intervals, no concerning ST changes.  [name] was evaluated in Emergency Department on 08/05/2019 for the symptoms described in the history of present illness. She was evaluated in the context of the global COVID-19 pandemic, which necessitated consideration that the patient might be at risk for infection with the SARS-CoV-2 virus that causes COVID-19. Institutional protocols and algorithms that pertain to the evaluation of patients at risk for COVID-19 are in a state of rapid change based on information released by regulatory bodies including the CDC and federal and state organizations. These policies and algorithms were followed during the patient's care in the ED.  ____________________________________________   FINAL CLINICAL IMPRESSION(S) / ED DIAGNOSES  Hypertension   08/07/2019, MD 08/05/19  08/07/19    2330, MD 09/15/20 [phone]

## 2019-08-05 NOTE — ED Triage Notes (Signed)
Patient reports swelling to right lower jaw and elevated blood pressure.  Patient reports history of hypertension.

## 2019-08-05 NOTE — Telephone Encounter (Signed)
Will evaluate.

## 2019-08-05 NOTE — Telephone Encounter (Signed)
Pt left v/m that she has swelling in front of rt ear,no pain but slight discomfort; has some stiffness in rt neck when turns head. Pt said she can bend her chin down to touch her chest with no pain. When chews gum on rt side can hear something; no difficulty brething and no problems swallowing.Pt was seen earlier today at [facility] ED. Pt already has an ED FU appt on 08/07/19 at 9:40. Pt request sooner appt.appt was changed to 08/06/19 at 10:20.pt said she took an aleve prior to going to ED and that seemed to decrease the swelling and get rid of discomfort. Pt will take another aleve when the time comes. UC & ED precautions given and pt voiced understanding. FYI to Allayne Gitelman NP.

## 2019-08-06 ENCOUNTER — Ambulatory Visit: Payer: Federal, State, Local not specified - PPO | Admitting: Primary Care

## 2019-08-06 ENCOUNTER — Encounter: Payer: Self-pay | Admitting: Primary Care

## 2019-08-06 DIAGNOSIS — I10 Essential (primary) hypertension: Secondary | ICD-10-CM | POA: Diagnosis not present

## 2019-08-06 DIAGNOSIS — R220 Localized swelling, mass and lump, head: Secondary | ICD-10-CM | POA: Insufficient documentation

## 2019-08-06 HISTORY — DX: Localized swelling, mass and lump, head: R22.0

## 2019-08-06 MED ORDER — AMLODIPINE BESYLATE 5 MG PO TABS: 5.0000 mg | ORAL_TABLET | Freq: Every day | ORAL | 3 refills | Status: DC

## 2019-08-06 NOTE — Assessment & Plan Note (Signed)
Significant elevated BP readings off Amlodipine. Will resume Amlodipine, start at 5 mg. She will monitor BP at home and notify if she sees readings at or above 135/90.  Ed notes, labs, ECG reviewed.

## 2019-08-06 NOTE — Patient Instructions (Signed)
Continue Amlodipine 5 mg once daily for blood pressure. I sent a new prescription to your pharmacy.  Monitor your blood pressure and notify me if you see readings that are consistently at or above 135/90.  It was a pleasure to see you today!

## 2019-08-06 NOTE — Addendum Note (Signed)
Addended by: Doreene Nest on: 08/06/2019 10:48 AM   Modules accepted: Orders

## 2019-08-06 NOTE — Progress Notes (Signed)
Subjective:    Patient ID: Melody Hicks, female    DOB: [birth year], 59 y.o.   MRN: [id number]  HPI  This visit occurred during the SARS-CoV-2 public health emergency.  Safety protocols were in place, including screening questions prior to the visit, additional usage of staff PPE, and extensive cleaning of exam room while observing appropriate contact time as indicated for disinfecting solutions.   Ms. Melody Hicks is a 59 year old female with a history of hypertension who presents today for emergency department follow up.  She presented to [facility] ED yesterday morning with a chief complaint of elevated blood pressure and right sided facial swelling. During her visit she endorsed that she hadn't been taking her prescribed Amlodipine for the last month due to normal BP readings.   During her stay in the ED she underwent lab work which was negative for systemic infection, normal renal function. ECG with sinus tachycardia, no concerning ST changes. BP was elevated with readings of 186/93, then 195 systolic. She endorsed taking 10 mg of her Amlodipine prior to arrival. Exam was reassuring. She was discharged home later that day with recommendations for PCP follow up.  Since her ED visit she's resumed her Amlodipine 10 mg. Last night she took 5 mg.   The day prior to her visit she developed a sudden onset of "odd sensation" to her right jaw with radiation to right ear. No pain. This occurred with chewing different foods (pasta, cheesecake). Later that evening she noticed moderate swelling to the right parotid area with discomfort behind her right ear. She's been taking Aleve and has noticed improvement and near resolve.   CBC in the ED without leukocytosis. She denies pain, fevers, headaches, dizziness, numbness.  BP Readings from Last 3 Encounters:  08/06/19 116/74  08/05/19 (!) 144/83  06/28/19 126/82     Review of Systems  Eyes: Negative for visual disturbance.  Respiratory: Negative for  shortness of breath.   Cardiovascular: Negative for chest pain.  Skin: Negative for color change.       Facial swelling 2 days ago  Neurological: Negative for dizziness.       Past Medical History:  Diagnosis Date  . Anxiety   . Herniated disc, cervical   . Hypertension      Social History   Socioeconomic History  . Marital status: Single    Spouse name: Not on file  . Number of children: Not on file  . Years of education: Not on file  . Highest education level: Not on file  Occupational History  . Not on file  Tobacco Use  . Smoking status: Former Games developer  . Smokeless tobacco: Never Used  Substance and Sexual Activity  . Alcohol use: Not Currently  . Drug use: Never  . Sexual activity: Not Currently  Other Topics Concern  . Not on file  Social History Narrative   Single.   1 son. 1 grandchild.   Working part time.   Social Determinants of Health   Financial Resource Strain:   . Difficulty of Paying Living Expenses: Not on file  Food Insecurity:   . Worried About Programme researcher, broadcasting/film/video in the Last Year: Not on file  . Ran Out of Food in the Last Year: Not on file  Transportation Needs:   . Lack of Transportation (Medical): Not on file  . Lack of Transportation (Non-Medical): Not on file  Physical Activity:   . Days of Exercise per Week: Not on file  .  Minutes of Exercise per Session: Not on file  Stress:   . Feeling of Stress : Not on file  Social Connections:   . Frequency of Communication with Friends and Family: Not on file  . Frequency of Social Gatherings with Friends and Family: Not on file  . Attends Religious Services: Not on file  . Active Member of Clubs or Organizations: Not on file  . Attends Archivist Meetings: Not on file  . Marital Status: Not on file  Intimate Partner Violence:   . Fear of Current or Ex-Partner: Not on file  . Emotionally Abused: Not on file  . Physically Abused: Not on file  . Sexually Abused: Not on file     Past Surgical History:  Procedure Laterality Date  . LYMPH NODE BIOPSY      Family History  Problem Relation Age of Onset  . Diabetes Mother   . Stroke Mother   . Hypertension Mother   . Heart disease Mother   . Hypertension Father   . Stroke Father     Allergies  Allergen Reactions  . Latex Itching  . Lisinopril Other (See Comments)    Other reaction(s): Hypotension Low bp,   . Iodinated Diagnostic Agents Nausea And Vomiting    Current Outpatient Medications on File Prior to Visit  Medication Sig Dispense Refill  . ascorbic acid (VITAMIN C) 500 MG tablet Take by mouth.    Marland Kitchen aspirin 81 MG EC tablet Take by mouth.    . Black Currant Seed Oil 500 MG CAPS Take by mouth.    . Calcium-Magnesium-Vitamin D [id number] MG-MG-UNIT TB24 Take by mouth.    . DIGESTIVE ENZYMES PO Take by mouth.    . Omega-3 Fatty Acids (FISH OIL) 1000 MG CAPS Take by mouth.     No current facility-administered medications on file prior to visit.    BP 116/74   Pulse 80   Temp (!) 97.5 F (36.4 C) (Temporal)   Ht 5\' 4"  (1.626 m)   Wt 123 lb 4 oz (55.9 kg)   SpO2 97%   BMI 21.16 kg/m    Objective:   Physical Exam  Constitutional: She appears well-nourished.  Cardiovascular: Normal rate and regular rhythm.  Respiratory: Effort normal and breath sounds normal.  Musculoskeletal:     Cervical back: Neck supple.  Skin: Skin is warm and dry. No erythema.  No right sided facial swelling, tenderness, lymph node enlargement.   Psychiatric: She has a normal mood and affect.           Assessment & Plan:

## 2019-08-06 NOTE — Assessment & Plan Note (Signed)
Acute two days ago, since has nearly resolved. Unclear etiology, perhaps parotitis? Blocked duct?  No evidence of rash, doubt shingles.   She will notify if symptoms return.

## 2019-08-07 ENCOUNTER — Ambulatory Visit: Payer: Federal, State, Local not specified - PPO | Admitting: Primary Care

## 2019-09-16 ENCOUNTER — Other Ambulatory Visit (INDEPENDENT_AMBULATORY_CARE_PROVIDER_SITE_OTHER): Payer: Federal, State, Local not specified - PPO

## 2019-09-16 ENCOUNTER — Other Ambulatory Visit: Payer: Self-pay

## 2019-09-16 DIAGNOSIS — I10 Essential (primary) hypertension: Secondary | ICD-10-CM

## 2019-09-16 DIAGNOSIS — Z1152 Encounter for screening for COVID-19: Secondary | ICD-10-CM

## 2019-09-16 LAB — SARS-COV-2 IGG: SARS-COV-2 IgG: 0.07

## 2019-09-16 LAB — COMPREHENSIVE METABOLIC PANEL
ALT: 8 U/L (ref 0–35)
AST: 13 U/L (ref 0–37)
Albumin: 4.3 g/dL (ref 3.5–5.2)
Alkaline Phosphatase: 54 U/L (ref 39–117)
BUN: 14 mg/dL (ref 6–23)
CO2: 29 mEq/L (ref 19–32)
Calcium: 9.4 mg/dL (ref 8.4–10.5)
Chloride: 106 mEq/L (ref 96–112)
Creatinine, Ser: 0.63 mg/dL (ref 0.40–1.20)
GFR: 116.24 mL/min (ref >60.00)
Glucose, Bld: 89 mg/dL (ref 70–99)
Potassium: 3.8 mEq/L (ref 3.5–5.1)
Sodium: 142 mEq/L (ref 135–145)
Total Bilirubin: 0.3 mg/dL (ref 0.2–1.2)
Total Protein: 7.5 g/dL (ref 6.0–8.3)

## 2019-09-16 LAB — LIPID PANEL
Cholesterol: 146 mg/dL (ref 0–200)
HDL: 51.1 mg/dL (ref >39.00)
LDL Cholesterol: 86 mg/dL (ref 0–99)
NonHDL: 95.09
Total CHOL/HDL Ratio: 3
Triglycerides: 44 mg/dL (ref 0.0–149.0)
VLDL: 8.8 mg/dL (ref 0.0–40.0)

## 2019-09-16 LAB — CBC
HCT: 36.1 % (ref 36.0–46.0)
Hemoglobin: 11.8 g/dL — ABNORMAL LOW (ref 12.0–15.0)
MCHC: 32.6 g/dL (ref 30.0–36.0)
MCV: 83.6 fl (ref 78.0–100.0)
Platelets: 302 10*3/uL (ref 150.0–400.0)
RBC: 4.32 Mil/uL (ref 3.87–5.11)
RDW: 14.5 % (ref 11.5–15.5)
WBC: 4.6 10*3/uL (ref 4.0–10.5)

## 2019-09-23 ENCOUNTER — Encounter: Payer: Federal, State, Local not specified - PPO | Admitting: Primary Care

## 2019-09-24 ENCOUNTER — Encounter: Payer: Self-pay | Admitting: Primary Care

## 2019-09-24 ENCOUNTER — Other Ambulatory Visit: Payer: Self-pay

## 2019-09-24 ENCOUNTER — Ambulatory Visit (INDEPENDENT_AMBULATORY_CARE_PROVIDER_SITE_OTHER): Payer: Federal, State, Local not specified - PPO | Admitting: Primary Care

## 2019-09-24 VITALS — BP 130/82 | HR 98 | Temp 96.6°F | Ht 64.0 in | Wt 117.8 lb

## 2019-09-24 DIAGNOSIS — Z0000 Encounter for general adult medical examination without abnormal findings: Secondary | ICD-10-CM | POA: Insufficient documentation

## 2019-09-24 DIAGNOSIS — I10 Essential (primary) hypertension: Secondary | ICD-10-CM | POA: Diagnosis not present

## 2019-09-24 NOTE — Assessment & Plan Note (Signed)
Tetanus UTD, declines Shingrix series. Mammogram UTD. Colonoscopy UTD per patient, completed in [place] in 2015.  Encouraged a healthy diet, regular exercise. Exam today unremarkable. Labs reviewed.

## 2019-09-24 NOTE — Assessment & Plan Note (Signed)
Stable in the office today, continue Amlodipine 5 mg.  

## 2019-09-24 NOTE — Patient Instructions (Signed)
Start exercising. You should be getting 150 minutes of moderate intensity exercise weekly.  Continue to work on a healthy diet. Ensure you are consuming 64 ounces of water daily.  It was a pleasure to see you today!   Preventive Care 5-61 Years Old, Female Preventive care refers to visits with your health care provider and lifestyle choices that can promote health and wellness. This includes:  A yearly physical exam. This may also be called an annual well check.  Regular dental visits and eye exams.  Immunizations.  Screening for certain conditions.  Healthy lifestyle choices, such as eating a healthy diet, getting regular exercise, not using drugs or products that contain nicotine and tobacco, and limiting alcohol use. What can I expect for my preventive care visit? Physical exam Your health care provider will check your:  Height and weight. This may be used to calculate body mass index (BMI), which tells if you are at a healthy weight.  Heart rate and blood pressure.  Skin for abnormal spots. Counseling Your health care provider may ask you questions about your:  Alcohol, tobacco, and drug use.  Emotional well-being.  Home and relationship well-being.  Sexual activity.  Eating habits.  Work and work Statistician.  Method of birth control.  Menstrual cycle.  Pregnancy history. What immunizations do I need?  Influenza (flu) vaccine  This is recommended every year. Tetanus, diphtheria, and pertussis (Tdap) vaccine  You may need a Td booster every 10 years. Varicella (chickenpox) vaccine  You may need this if you have not been vaccinated. Zoster (shingles) vaccine  You may need this after age 62. Measles, mumps, and rubella (MMR) vaccine  You may need at least one dose of MMR if you were born in 1957 or later. You may also need a second dose. Pneumococcal conjugate (PCV13) vaccine  You may need this if you have certain conditions and were not  previously vaccinated. Pneumococcal polysaccharide (PPSV23) vaccine  You may need one or two doses if you smoke cigarettes or if you have certain conditions. Meningococcal conjugate (MenACWY) vaccine  You may need this if you have certain conditions. Hepatitis A vaccine  You may need this if you have certain conditions or if you travel or work in places where you may be exposed to hepatitis A. Hepatitis B vaccine  You may need this if you have certain conditions or if you travel or work in places where you may be exposed to hepatitis B. Haemophilus influenzae type b (Hib) vaccine  You may need this if you have certain conditions. Human papillomavirus (HPV) vaccine  If recommended by your health care provider, you may need three doses over 6 months. You may receive vaccines as individual doses or as more than one vaccine together in one shot (combination vaccines). Talk with your health care provider about the risks and benefits of combination vaccines. What tests do I need? Blood tests  Lipid and cholesterol levels. These may be checked every 5 years, or more frequently if you are over 33 years old.  Hepatitis C test.  Hepatitis B test. Screening  Lung cancer screening. You may have this screening every year starting at age 67 if you have a 30-pack-year history of smoking and currently smoke or have quit within the past 15 years.  Colorectal cancer screening. All adults should have this screening starting at age 36 and continuing until age 41. Your health care provider may recommend screening at age 76 if you are at increased risk. You  will have tests every 1-10 years, depending on your results and the type of screening test.  Diabetes screening. This is done by checking your blood sugar (glucose) after you have not eaten for a while (fasting). You may have this done every 1-3 years.  Mammogram. This may be done every 1-2 years. Talk with your health care provider about when you  should start having regular mammograms. This may depend on whether you have a family history of breast cancer.  BRCA-related cancer screening. This may be done if you have a family history of breast, ovarian, tubal, or peritoneal cancers.  Pelvic exam and Pap test. This may be done every 3 years starting at age 56. Starting at age 27, this may be done every 5 years if you have a Pap test in combination with an HPV test. Other tests  Sexually transmitted disease (STD) testing.  Bone density scan. This is done to screen for osteoporosis. You may have this scan if you are at high risk for osteoporosis. Follow these instructions at home: Eating and drinking  Eat a diet that includes fresh fruits and vegetables, whole grains, lean protein, and low-fat dairy.  Take vitamin and mineral supplements as recommended by your health care provider.  Do not drink alcohol if: ? Your health care provider tells you not to drink. ? You are pregnant, may be pregnant, or are planning to become pregnant.  If you drink alcohol: ? Limit how much you have to 0-1 drink a day. ? Be aware of how much alcohol is in your drink. In the [place] one drink equals one 12 oz bottle of beer (355 mL), one 5 oz glass of wine (148 mL), or one 1 oz glass of hard liquor (44 mL). Lifestyle  Take daily care of your teeth and gums.  Stay active. Exercise for at least 30 minutes on 5 or more days each week.  Do not use any products that contain nicotine or tobacco, such as cigarettes, e-cigarettes, and chewing tobacco. If you need help quitting, ask your health care provider.  If you are sexually active, practice safe sex. Use a condom or other form of birth control (contraception) in order to prevent pregnancy and STIs (sexually transmitted infections).  If told by your health care provider, take low-dose aspirin daily starting at age 41. What's next?  Visit your health care provider once a year for a well check  visit.  Ask your health care provider how often you should have your eyes and teeth checked.  Stay up to date on all vaccines. This information is not intended to replace advice given to you by your health care provider. Make sure you discuss any questions you have with your health care provider. Document Revised: 02/15/2018 Document Reviewed: 02/15/2018 Elsevier Patient Education  2020 [employer].

## 2019-09-24 NOTE — Progress Notes (Signed)
Subjective:    Patient ID: Melody Hicks, female    DOB: 07-Aug-1960, 59 y.o.   MRN: [id number]  HPI  This visit occurred during the SARS-CoV-2 public health emergency.  Safety protocols were in place, including screening questions prior to the visit, additional usage of staff PPE, and extensive cleaning of exam room while observing appropriate contact time as indicated for disinfecting solutions.   Melody Hicks is a 59 year old female who presents today for complete physical.  Immunizations: -Tetanus: Completed in 2015 -Influenza: Did not complete -Shingles: Declines   Diet: She endorses a healthy diet.  Exercise: No regular exercise   Eye exam: No recent eye exam, history of Lasik Dental exam: Completes semi-annually   Pap Smear: Completed in 2020 Mammogram: Completed in June 2020 Colonoscopy: Completed around 2015 or 2016, thinks she's due in 2025 Hep C Screen: Negative in 2020  BP Readings from Last 3 Encounters:  09/24/19 130/82  08/06/19 116/74  08/05/19 (!) 144/83     Review of Systems  Constitutional: Negative for unexpected weight change.  HENT: Negative for rhinorrhea.   Respiratory: Negative for cough and shortness of breath.   Cardiovascular: Negative for chest pain.  Gastrointestinal: Negative for constipation and diarrhea.  Genitourinary: Negative for difficulty urinating.  Musculoskeletal: Negative for arthralgias and myalgias.  Skin: Negative for rash.  Allergic/Immunologic: Negative for environmental allergies.  Neurological: Negative for dizziness and headaches.  Psychiatric/Behavioral: The patient is not nervous/anxious.        Past Medical History:  Diagnosis Date  . Anxiety   . Herniated disc, cervical   . Hypertension      Social History   Socioeconomic History  . Marital status: Single    Spouse name: Not on file  . Number of children: Not on file  . Years of education: Not on file  . Highest education level: Not on file    Occupational History  . Not on file  Tobacco Use  . Smoking status: Former Games developer  . Smokeless tobacco: Never Used  Substance and Sexual Activity  . Alcohol use: Not Currently  . Drug use: Never  . Sexual activity: Not Currently  Other Topics Concern  . Not on file  Social History Narrative   Single.   1 son. 1 grandchild.   Working part time.   Social Determinants of Health   Financial Resource Strain:   . Difficulty of Paying Living Expenses:   Food Insecurity:   . Worried About Programme researcher, broadcasting/film/video in the Last Year:   . Barista in the Last Year:   Transportation Needs:   . Freight forwarder (Medical):   Marland Kitchen Lack of Transportation (Non-Medical):   Physical Activity:   . Days of Exercise per Week:   . Minutes of Exercise per Session:   Stress:   . Feeling of Stress :   Social Connections:   . Frequency of Communication with Friends and Family:   . Frequency of Social Gatherings with Friends and Family:   . Attends Religious Services:   . Active Member of Clubs or Organizations:   . Attends Banker Meetings:   Marland Kitchen Marital Status:   Intimate Partner Violence:   . Fear of Current or Ex-Partner:   . Emotionally Abused:   Marland Kitchen Physically Abused:   . Sexually Abused:     Past Surgical History:  Procedure Laterality Date  . LYMPH NODE BIOPSY      Family History  Problem  Relation Age of Onset  . Diabetes Mother   . Stroke Mother   . Hypertension Mother   . Heart disease Mother   . Hypertension Father   . Stroke Father     Allergies  Allergen Reactions  . Latex Itching  . Lisinopril Other (See Comments)    Other reaction(s): Hypotension Low bp,   . Iodinated Diagnostic Agents Nausea And Vomiting    Current Outpatient Medications on File Prior to Visit  Medication Sig Dispense Refill  . amLODipine (NORVASC) 5 MG tablet Take 1 tablet (5 mg total) by mouth daily. For blood pressure. 90 tablet 3  . ascorbic acid (VITAMIN C) 500 MG tablet  Take 1,000 mg by mouth.    Marland Kitchen aspirin 81 MG EC tablet Take by mouth.    . Black Currant Seed Oil 500 MG CAPS Take by mouth.    . Cholecalciferol 100 MCG (4000 UT) CAPS Take 4,000 Int'l Units by mouth daily.    Marland Kitchen DIGESTIVE ENZYMES PO Take by mouth.    . Omega-3 Fatty Acids (FISH OIL) 1000 MG CAPS Take by mouth.    . vitamin B-12 (CYANOCOBALAMIN) 1000 MCG tablet Take 1,000 mcg by mouth daily.     No current facility-administered medications on file prior to visit.    BP 130/82   Pulse 98   Temp (!) 96.6 F (35.9 C) (Temporal)   Ht 5\' 4"  (1.626 m)   Wt 117 lb 12 oz (53.4 kg)   SpO2 99%   BMI 20.21 kg/m    Objective:   Physical Exam  Constitutional: She is oriented to person, place, and time. She appears well-nourished.  HENT:  Right Ear: Tympanic membrane and ear canal normal.  Left Ear: Tympanic membrane and ear canal normal.  Mouth/Throat: Oropharynx is clear and moist.  Eyes: Pupils are equal, round, and reactive to light. EOM are normal.  Cardiovascular: Normal rate and regular rhythm.  Respiratory: Effort normal and breath sounds normal.  GI: Soft. Bowel sounds are normal. There is no abdominal tenderness.  Musculoskeletal:        General: Normal range of motion.     Cervical back: Neck supple.  Neurological: She is alert and oriented to person, place, and time. No cranial nerve deficit.  Reflex Scores:      Patellar reflexes are 2+ on the right side and 2+ on the left side. Skin: Skin is warm and dry.  Psychiatric: She has a normal mood and affect.           Assessment & Plan:

## 2020-01-27 ENCOUNTER — Other Ambulatory Visit: Payer: Federal, State, Local not specified - PPO

## 2020-02-05 NOTE — Telephone Encounter (Signed)
Melody Burton,  Do know what to do about a certified medical record?

## 2020-02-05 NOTE — Telephone Encounter (Signed)
Please advise 

## 2020-02-05 NOTE — Telephone Encounter (Signed)
Here is what I found:  Certified copy of medical record (clinic, doctor, or hospital) or letter providing extract data from the medical record showing the applicants name and the applicants DOB or age  A certified photocopy is a Ambulance person of the original document in the custodian's possession to which the custodian affixes a signature, stamp, or seal with a statement attesting to the accuracy of the photocopy.  A properly certified extract document is a document that contains a portion of pertinent identifiable information taken from a larger original document of record such as a medical or school record that is certified by the custodian of record to be accurate.   Let me know if you have any questions.

## 2020-02-06 NOTE — Telephone Encounter (Signed)
Can one of you ladies help?! Patient's name is Melody Hicks DOB [birth year]

## 2020-02-28 ENCOUNTER — Encounter: Payer: Self-pay | Admitting: Radiology

## 2020-04-03 ENCOUNTER — Other Ambulatory Visit: Payer: Self-pay | Admitting: Primary Care

## 2020-04-03 DIAGNOSIS — Z1231 Encounter for screening mammogram for malignant neoplasm of breast: Secondary | ICD-10-CM

## 2020-05-04 ENCOUNTER — Ambulatory Visit
Admission: RE | Admit: 2020-05-04 | Discharge: 2020-05-04 | Disposition: A | Discharge status: Home / Self Care | Payer: Federal, State, Local not specified - PPO | Source: Ambulatory Visit | Attending: Primary Care | Admitting: Primary Care

## 2020-05-04 ENCOUNTER — Other Ambulatory Visit: Payer: Self-pay

## 2020-05-04 DIAGNOSIS — Z1231 Encounter for screening mammogram for malignant neoplasm of breast: Secondary | ICD-10-CM | POA: Insufficient documentation

## 2020-05-19 DIAGNOSIS — M50323 Other cervical disc degeneration at C6-C7 level: Secondary | ICD-10-CM | POA: Diagnosis not present

## 2020-05-19 DIAGNOSIS — M9902 Segmental and somatic dysfunction of thoracic region: Secondary | ICD-10-CM | POA: Diagnosis not present

## 2020-05-19 DIAGNOSIS — M9901 Segmental and somatic dysfunction of cervical region: Secondary | ICD-10-CM | POA: Diagnosis not present

## 2020-05-19 DIAGNOSIS — M5384 Other specified dorsopathies, thoracic region: Secondary | ICD-10-CM | POA: Diagnosis not present

## 2020-05-25 DIAGNOSIS — M9901 Segmental and somatic dysfunction of cervical region: Secondary | ICD-10-CM | POA: Diagnosis not present

## 2020-05-25 DIAGNOSIS — M5384 Other specified dorsopathies, thoracic region: Secondary | ICD-10-CM | POA: Diagnosis not present

## 2020-05-25 DIAGNOSIS — M9902 Segmental and somatic dysfunction of thoracic region: Secondary | ICD-10-CM | POA: Diagnosis not present

## 2020-05-25 DIAGNOSIS — M50323 Other cervical disc degeneration at C6-C7 level: Secondary | ICD-10-CM | POA: Diagnosis not present

## 2020-05-27 DIAGNOSIS — M9901 Segmental and somatic dysfunction of cervical region: Secondary | ICD-10-CM | POA: Diagnosis not present

## 2020-05-27 DIAGNOSIS — M5384 Other specified dorsopathies, thoracic region: Secondary | ICD-10-CM | POA: Diagnosis not present

## 2020-05-27 DIAGNOSIS — M50323 Other cervical disc degeneration at C6-C7 level: Secondary | ICD-10-CM | POA: Diagnosis not present

## 2020-05-27 DIAGNOSIS — M9902 Segmental and somatic dysfunction of thoracic region: Secondary | ICD-10-CM | POA: Diagnosis not present

## 2020-06-01 DIAGNOSIS — M5384 Other specified dorsopathies, thoracic region: Secondary | ICD-10-CM | POA: Diagnosis not present

## 2020-06-01 DIAGNOSIS — M9902 Segmental and somatic dysfunction of thoracic region: Secondary | ICD-10-CM | POA: Diagnosis not present

## 2020-06-01 DIAGNOSIS — M9901 Segmental and somatic dysfunction of cervical region: Secondary | ICD-10-CM | POA: Diagnosis not present

## 2020-06-01 DIAGNOSIS — M50323 Other cervical disc degeneration at C6-C7 level: Secondary | ICD-10-CM | POA: Diagnosis not present

## 2020-06-03 DIAGNOSIS — M9901 Segmental and somatic dysfunction of cervical region: Secondary | ICD-10-CM | POA: Diagnosis not present

## 2020-06-03 DIAGNOSIS — M9902 Segmental and somatic dysfunction of thoracic region: Secondary | ICD-10-CM | POA: Diagnosis not present

## 2020-06-03 DIAGNOSIS — M5384 Other specified dorsopathies, thoracic region: Secondary | ICD-10-CM | POA: Diagnosis not present

## 2020-06-03 DIAGNOSIS — M50323 Other cervical disc degeneration at C6-C7 level: Secondary | ICD-10-CM | POA: Diagnosis not present

## 2020-06-10 DIAGNOSIS — M9901 Segmental and somatic dysfunction of cervical region: Secondary | ICD-10-CM | POA: Diagnosis not present

## 2020-06-10 DIAGNOSIS — M50323 Other cervical disc degeneration at C6-C7 level: Secondary | ICD-10-CM | POA: Diagnosis not present

## 2020-06-10 DIAGNOSIS — M9902 Segmental and somatic dysfunction of thoracic region: Secondary | ICD-10-CM | POA: Diagnosis not present

## 2020-06-10 DIAGNOSIS — M5384 Other specified dorsopathies, thoracic region: Secondary | ICD-10-CM | POA: Diagnosis not present

## 2020-06-15 ENCOUNTER — Other Ambulatory Visit: Payer: Self-pay

## 2020-06-15 ENCOUNTER — Other Ambulatory Visit (HOSPITAL_COMMUNITY)
Admission: RE | Admit: 2020-06-15 | Discharge: 2020-06-15 | Disposition: A | Discharge status: Home / Self Care | Payer: Federal, State, Local not specified - PPO | Source: Ambulatory Visit | Attending: Obstetrics & Gynecology | Admitting: Obstetrics & Gynecology

## 2020-06-15 ENCOUNTER — Ambulatory Visit (INDEPENDENT_AMBULATORY_CARE_PROVIDER_SITE_OTHER): Payer: Federal, State, Local not specified - PPO | Admitting: Obstetrics & Gynecology

## 2020-06-15 ENCOUNTER — Encounter: Payer: Self-pay | Admitting: Obstetrics & Gynecology

## 2020-06-15 VITALS — BP 137/80 | HR 92 | Ht 64.0 in | Wt 125.6 lb

## 2020-06-15 DIAGNOSIS — M50323 Other cervical disc degeneration at C6-C7 level: Secondary | ICD-10-CM | POA: Diagnosis not present

## 2020-06-15 DIAGNOSIS — Z01419 Encounter for gynecological examination (general) (routine) without abnormal findings: Secondary | ICD-10-CM | POA: Insufficient documentation

## 2020-06-15 DIAGNOSIS — Z113 Encounter for screening for infections with a predominantly sexual mode of transmission: Secondary | ICD-10-CM | POA: Diagnosis not present

## 2020-06-15 DIAGNOSIS — M5384 Other specified dorsopathies, thoracic region: Secondary | ICD-10-CM | POA: Diagnosis not present

## 2020-06-15 DIAGNOSIS — M9901 Segmental and somatic dysfunction of cervical region: Secondary | ICD-10-CM | POA: Diagnosis not present

## 2020-06-15 DIAGNOSIS — M9902 Segmental and somatic dysfunction of thoracic region: Secondary | ICD-10-CM | POA: Diagnosis not present

## 2020-06-15 NOTE — Patient Instructions (Signed)

## 2020-06-15 NOTE — Progress Notes (Addendum)
GYNECOLOGY ANNUAL PREVENTATIVE CARE ENCOUNTER NOTE  History:     Melody Hicks is a 59 y.o. G38P1011 female here for a routine annual gynecologic exam.  Current complaints: none.   Denies abnormal vaginal bleeding, discharge, pelvic pain, problems with intercourse or other gynecologic concerns.    Gynecologic History No LMP recorded. Patient is postmenopausal. Contraception: post menopausal status Last Pap: 04/04/2019. Results were: normal with negative HPV Last mammogram: 05/04/2020. Results were: normal  Obstetric History OB History  Gravida Para Term Preterm AB Living  2 1 1   1 1   SAB IAB Ectopic Multiple Live Births  1       1    # Outcome Date GA Lbr Len/2nd Weight Sex Delivery Anes PTL Lv  2 SAB 2001          1 Term 45    M    LIV    Past Medical History:  Diagnosis Date  . Anxiety   . Herniated disc, cervical   . Hypertension   . Right facial swelling 08/06/2019    Past Surgical History:  Procedure Laterality Date  . LYMPH NODE BIOPSY      Current Outpatient Medications on File Prior to Visit  Medication Sig Dispense Refill  . amLODipine (NORVASC) 5 MG tablet Take 1 tablet (5 mg total) by mouth daily. For blood pressure. 90 tablet 3  . ascorbic acid (VITAMIN C) 500 MG tablet Take 1,000 mg by mouth.    08/08/2019 aspirin 81 MG EC tablet Take by mouth.    . Cholecalciferol 100 MCG (4000 UT) CAPS Take 4,000 Int'l Units by mouth daily.    Marland Kitchen DIGESTIVE ENZYMES PO Take by mouth.    . Omega-3 Fatty Acids (FISH OIL) 1000 MG CAPS Take by mouth.    . vitamin B-12 (CYANOCOBALAMIN) 1000 MCG tablet Take 1,000 mcg by mouth daily.    Marland Kitchen zinc gluconate 50 MG tablet Take 50 mg by mouth daily.    . Black Currant Seed Oil 500 MG CAPS Take by mouth. (Patient not taking: Reported on 06/15/2020)     No current facility-administered medications on file prior to visit.    Allergies  Allergen Reactions  . Latex Itching  . Lisinopril Other (See Comments)    Other reaction(s):  Hypotension Low bp,   . Iodinated Diagnostic Agents Nausea And Vomiting    Social History:  reports that she has quit smoking. She has never used smokeless tobacco. She reports previous alcohol use. She reports that she does not use drugs.  Family History  Problem Relation Age of Onset  . Diabetes Mother   . Stroke Mother   . Hypertension Mother   . Heart disease Mother   . Hypertension Father   . Stroke Father     The following portions of the patient's history were reviewed and updated as appropriate: allergies, current medications, past family history, past medical history, past social history, past surgical history and problem list.  Review of Systems Pertinent items noted in HPI and remainder of comprehensive ROS otherwise negative.  Physical Exam:  BP 137/80   Pulse 92   Ht 5\' 4"  (1.626 m)   Wt 125 lb 9.6 oz (57 kg)   BMI 21.56 kg/m  CONSTITUTIONAL: Well-developed, well-nourished female in no acute distress.  HENT:  Normocephalic, atraumatic, External right and left ear normal.  EYES: Conjunctivae and EOM are normal. Pupils are equal, round, and reactive to light. No scleral icterus.  NECK: Normal range  of motion, supple, no masses.  Normal thyroid.  SKIN: Skin is warm and dry. No rash noted. Not diaphoretic. No erythema. No pallor. MUSCULOSKELETAL: Normal range of motion. No tenderness.  No cyanosis, clubbing, or edema.   NEUROLOGIC: Alert and oriented to person, place, and time. Normal reflexes, muscle tone coordination.  PSYCHIATRIC: Normal mood and affect. Normal behavior. Normal judgment and thought content. CARDIOVASCULAR: Normal heart rate noted, regular rhythm RESPIRATORY: Clear to auscultation bilaterally. Effort and breath sounds normal, no problems with respiration noted. BREASTS: Symmetric in size. No masses, tenderness, skin changes, nipple drainage, or lymphadenopathy bilaterally. Performed in the presence of a chaperone. ABDOMEN: Soft, no distention noted.   No tenderness, rebound or guarding.  PELVIC: Normal appearing external genitalia and urethral meatus; normal appearing vaginal mucosa and cervix. Mild-moderate atrophy.  No abnormal discharge noted.  Pap smear obtained, mild bleeding after pap.  Normal uterine size, no other palpable masses, no uterine or adnexal tenderness.  Performed in the presence of a chaperone.   Assessment and Plan:      1. Well woman exam with routine gynecological exam - Cytology - PAP - Hepatitis C antibody - Hepatitis B surface antigen - RPR - HIV Antibody (routine testing w rflx) Will follow up all results and manage accordingly. Mammogramis up to date Routine preventative health maintenance measures emphasized. Please refer to After Visit Summary for other counseling recommendations.      Jaynie Collins, MD, FACOG Obstetrician & Gynecologist, [facility] for [employer], [facility]

## 2020-06-16 LAB — HEPATITIS B SURFACE ANTIGEN: Hepatitis B Surface Ag: NEGATIVE

## 2020-06-16 LAB — HIV ANTIBODY (ROUTINE TESTING W REFLEX): HIV Screen 4th Generation wRfx: NONREACTIVE

## 2020-06-16 LAB — RPR: RPR Ser Ql: NONREACTIVE

## 2020-06-16 LAB — HEPATITIS C ANTIBODY: Hep C Virus Ab: 0.1 s/co ratio (ref 0.0–0.9)

## 2020-06-17 DIAGNOSIS — M5384 Other specified dorsopathies, thoracic region: Secondary | ICD-10-CM | POA: Diagnosis not present

## 2020-06-17 DIAGNOSIS — M50323 Other cervical disc degeneration at C6-C7 level: Secondary | ICD-10-CM | POA: Diagnosis not present

## 2020-06-17 DIAGNOSIS — M9901 Segmental and somatic dysfunction of cervical region: Secondary | ICD-10-CM | POA: Diagnosis not present

## 2020-06-17 DIAGNOSIS — M9902 Segmental and somatic dysfunction of thoracic region: Secondary | ICD-10-CM | POA: Diagnosis not present

## 2020-06-18 LAB — CYTOLOGY - PAP
Chlamydia: NEGATIVE
Comment: NEGATIVE
Comment: NEGATIVE
Comment: NEGATIVE
Comment: NORMAL
Diagnosis: NEGATIVE
High risk HPV: NEGATIVE
Neisseria Gonorrhea: NEGATIVE
Trichomonas: NEGATIVE

## 2020-06-24 DIAGNOSIS — M9902 Segmental and somatic dysfunction of thoracic region: Secondary | ICD-10-CM | POA: Diagnosis not present

## 2020-06-24 DIAGNOSIS — M5384 Other specified dorsopathies, thoracic region: Secondary | ICD-10-CM | POA: Diagnosis not present

## 2020-06-24 DIAGNOSIS — M50323 Other cervical disc degeneration at C6-C7 level: Secondary | ICD-10-CM | POA: Diagnosis not present

## 2020-06-24 DIAGNOSIS — M9901 Segmental and somatic dysfunction of cervical region: Secondary | ICD-10-CM | POA: Diagnosis not present

## 2020-06-29 DIAGNOSIS — M50323 Other cervical disc degeneration at C6-C7 level: Secondary | ICD-10-CM | POA: Diagnosis not present

## 2020-06-29 DIAGNOSIS — M9901 Segmental and somatic dysfunction of cervical region: Secondary | ICD-10-CM | POA: Diagnosis not present

## 2020-06-29 DIAGNOSIS — M5384 Other specified dorsopathies, thoracic region: Secondary | ICD-10-CM | POA: Diagnosis not present

## 2020-06-29 DIAGNOSIS — M9902 Segmental and somatic dysfunction of thoracic region: Secondary | ICD-10-CM | POA: Diagnosis not present

## 2020-07-01 DIAGNOSIS — M5384 Other specified dorsopathies, thoracic region: Secondary | ICD-10-CM | POA: Diagnosis not present

## 2020-07-01 DIAGNOSIS — M9901 Segmental and somatic dysfunction of cervical region: Secondary | ICD-10-CM | POA: Diagnosis not present

## 2020-07-01 DIAGNOSIS — M9902 Segmental and somatic dysfunction of thoracic region: Secondary | ICD-10-CM | POA: Diagnosis not present

## 2020-07-01 DIAGNOSIS — M50323 Other cervical disc degeneration at C6-C7 level: Secondary | ICD-10-CM | POA: Diagnosis not present

## 2020-07-03 ENCOUNTER — Encounter: Payer: Self-pay | Admitting: Nurse Practitioner

## 2020-07-08 DIAGNOSIS — M5384 Other specified dorsopathies, thoracic region: Secondary | ICD-10-CM | POA: Diagnosis not present

## 2020-07-08 DIAGNOSIS — M9902 Segmental and somatic dysfunction of thoracic region: Secondary | ICD-10-CM | POA: Diagnosis not present

## 2020-07-08 DIAGNOSIS — M9901 Segmental and somatic dysfunction of cervical region: Secondary | ICD-10-CM | POA: Diagnosis not present

## 2020-07-08 DIAGNOSIS — M50323 Other cervical disc degeneration at C6-C7 level: Secondary | ICD-10-CM | POA: Diagnosis not present

## 2020-07-13 DIAGNOSIS — M50323 Other cervical disc degeneration at C6-C7 level: Secondary | ICD-10-CM | POA: Diagnosis not present

## 2020-07-13 DIAGNOSIS — M5384 Other specified dorsopathies, thoracic region: Secondary | ICD-10-CM | POA: Diagnosis not present

## 2020-07-13 DIAGNOSIS — M9901 Segmental and somatic dysfunction of cervical region: Secondary | ICD-10-CM | POA: Diagnosis not present

## 2020-07-13 DIAGNOSIS — M9902 Segmental and somatic dysfunction of thoracic region: Secondary | ICD-10-CM | POA: Diagnosis not present

## 2020-07-15 DIAGNOSIS — M9901 Segmental and somatic dysfunction of cervical region: Secondary | ICD-10-CM | POA: Diagnosis not present

## 2020-07-15 DIAGNOSIS — M50323 Other cervical disc degeneration at C6-C7 level: Secondary | ICD-10-CM | POA: Diagnosis not present

## 2020-07-15 DIAGNOSIS — M9902 Segmental and somatic dysfunction of thoracic region: Secondary | ICD-10-CM | POA: Diagnosis not present

## 2020-07-15 DIAGNOSIS — M5384 Other specified dorsopathies, thoracic region: Secondary | ICD-10-CM | POA: Diagnosis not present

## 2020-07-22 DIAGNOSIS — M50323 Other cervical disc degeneration at C6-C7 level: Secondary | ICD-10-CM | POA: Diagnosis not present

## 2020-07-22 DIAGNOSIS — M9902 Segmental and somatic dysfunction of thoracic region: Secondary | ICD-10-CM | POA: Diagnosis not present

## 2020-07-22 DIAGNOSIS — M9901 Segmental and somatic dysfunction of cervical region: Secondary | ICD-10-CM | POA: Diagnosis not present

## 2020-07-22 DIAGNOSIS — M5384 Other specified dorsopathies, thoracic region: Secondary | ICD-10-CM | POA: Diagnosis not present

## 2020-07-29 DIAGNOSIS — M5384 Other specified dorsopathies, thoracic region: Secondary | ICD-10-CM | POA: Diagnosis not present

## 2020-07-29 DIAGNOSIS — M9902 Segmental and somatic dysfunction of thoracic region: Secondary | ICD-10-CM | POA: Diagnosis not present

## 2020-07-29 DIAGNOSIS — M50323 Other cervical disc degeneration at C6-C7 level: Secondary | ICD-10-CM | POA: Diagnosis not present

## 2020-07-29 DIAGNOSIS — M9901 Segmental and somatic dysfunction of cervical region: Secondary | ICD-10-CM | POA: Diagnosis not present

## 2020-09-01 ENCOUNTER — Other Ambulatory Visit: Payer: Self-pay | Admitting: Primary Care

## 2020-09-01 DIAGNOSIS — I10 Essential (primary) hypertension: Secondary | ICD-10-CM

## 2020-09-01 NOTE — Telephone Encounter (Signed)
Patient due for CPE/follow up in April. Will need this for further refills. 30 day supply provided.

## 2020-09-03 NOTE — Telephone Encounter (Signed)
Called patient to schedule. LVM to call back and schedule cpe after 4/6.

## 2020-09-07 NOTE — Telephone Encounter (Signed)
Viewed schedule and patient scheduled 4/19. 

## 2020-10-02 ENCOUNTER — Other Ambulatory Visit: Payer: Self-pay | Admitting: Primary Care

## 2020-10-02 DIAGNOSIS — I10 Essential (primary) hypertension: Secondary | ICD-10-CM

## 2020-10-06 ENCOUNTER — Encounter: Payer: Federal, State, Local not specified - PPO | Admitting: Primary Care

## 2020-10-20 ENCOUNTER — Encounter: Payer: Federal, State, Local not specified - PPO | Admitting: Primary Care

## 2020-10-30 ENCOUNTER — Ambulatory Visit (INDEPENDENT_AMBULATORY_CARE_PROVIDER_SITE_OTHER): Payer: Federal, State, Local not specified - PPO | Admitting: Primary Care

## 2020-10-30 ENCOUNTER — Encounter: Payer: Self-pay | Admitting: Primary Care

## 2020-10-30 ENCOUNTER — Other Ambulatory Visit: Payer: Self-pay

## 2020-10-30 VITALS — BP 128/82 | HR 82 | Temp 98.2°F | Ht 64.0 in | Wt 126.0 lb

## 2020-10-30 DIAGNOSIS — I10 Essential (primary) hypertension: Secondary | ICD-10-CM

## 2020-10-30 DIAGNOSIS — Z0000 Encounter for general adult medical examination without abnormal findings: Secondary | ICD-10-CM

## 2020-10-30 LAB — CBC
HCT: 37.2 % (ref 36.0–46.0)
Hemoglobin: 12.1 g/dL (ref 12.0–15.0)
MCHC: 32.4 g/dL (ref 30.0–36.0)
MCV: 83 fl (ref 78.0–100.0)
Platelets: 259 10*3/uL (ref 150.0–400.0)
RBC: 4.49 Mil/uL (ref 3.87–5.11)
RDW: 13.8 % (ref 11.5–15.5)
WBC: 4 10*3/uL (ref 4.0–10.5)

## 2020-10-30 LAB — LIPID PANEL
Cholesterol: 149 mg/dL (ref 0–200)
HDL: 47 mg/dL (ref >39.00)
LDL Cholesterol: 85 mg/dL (ref 0–99)
NonHDL: 101.74
Total CHOL/HDL Ratio: 3
Triglycerides: 85 mg/dL (ref 0.0–149.0)
VLDL: 17 mg/dL (ref 0.0–40.0)

## 2020-10-30 LAB — COMPREHENSIVE METABOLIC PANEL
ALT: 9 U/L (ref 0–35)
AST: 15 U/L (ref 0–37)
Albumin: 4.5 g/dL (ref 3.5–5.2)
Alkaline Phosphatase: 50 U/L (ref 39–117)
BUN: 16 mg/dL (ref 6–23)
CO2: 30 mEq/L (ref 19–32)
Calcium: 9.5 mg/dL (ref 8.4–10.5)
Chloride: 104 mEq/L (ref 96–112)
Creatinine, Ser: 0.61 mg/dL (ref 0.40–1.20)
GFR: 96.01 mL/min (ref >60.00)
Glucose, Bld: 81 mg/dL (ref 70–99)
Potassium: 3.8 mEq/L (ref 3.5–5.1)
Sodium: 142 mEq/L (ref 135–145)
Total Bilirubin: 0.3 mg/dL (ref 0.2–1.2)
Total Protein: 7.9 g/dL (ref 6.0–8.3)

## 2020-10-30 MED ORDER — AMLODIPINE BESYLATE 5 MG PO TABS
5.0000 mg | ORAL_TABLET | Freq: Every day | ORAL | 3 refills | Status: DC
Start: 2020-10-30 — End: 2021-11-27

## 2020-10-30 NOTE — Patient Instructions (Signed)
Stop by the lab prior to leaving today. I will notify you of your results once received.   It was a pleasure to see you today!   Preventive Care 17-62 Years Old, Female Preventive care refers to lifestyle choices and visits with your health care provider that can promote health and wellness. This includes:  A yearly physical exam. This is also called an annual wellness visit.  Regular dental and eye exams.  Immunizations.  Screening for certain conditions.  Healthy lifestyle choices, such as: ? Eating a healthy diet. ? Getting regular exercise. ? Not using drugs or products that contain nicotine and tobacco. ? Limiting alcohol use. What can I expect for my preventive care visit? Physical exam Your health care provider will check your:  Height and weight. These may be used to calculate your BMI (body mass index). BMI is a measurement that tells if you are at a healthy weight.  Heart rate and blood pressure.  Body temperature.  Skin for abnormal spots. Counseling Your health care provider may ask you questions about your:  Past medical problems.  Family's medical history.  Alcohol, tobacco, and drug use.  Emotional well-being.  Home life and relationship well-being.  Sexual activity.  Diet, exercise, and sleep habits.  Work and work Statistician.  Access to firearms.  Method of birth control.  Menstrual cycle.  Pregnancy history. What immunizations do I need? Vaccines are usually given at various ages, according to a schedule. Your health care provider will recommend vaccines for you based on your age, medical history, and lifestyle or other factors, such as travel or where you work.   What tests do I need? Blood tests  Lipid and cholesterol levels. These may be checked every 5 years, or more often if you are over 41 years old.  Hepatitis C test.  Hepatitis B test. Screening  Lung cancer screening. You may have this screening every year starting at  age 71 if you have a 30-pack-year history of smoking and currently smoke or have quit within the past 15 years.  Colorectal cancer screening. ? All adults should have this screening starting at age 66 and continuing until age 9. ? Your health care provider may recommend screening at age 32 if you are at increased risk. ? You will have tests every 1-10 years, depending on your results and the type of screening test.  Diabetes screening. ? This is done by checking your blood sugar (glucose) after you have not eaten for a while (fasting). ? You may have this done every 1-3 years.  Mammogram. ? This may be done every 1-2 years. ? Talk with your health care provider about when you should start having regular mammograms. This may depend on whether you have a family history of breast cancer.  BRCA-related cancer screening. This may be done if you have a family history of breast, ovarian, tubal, or peritoneal cancers.  Pelvic exam and Pap test. ? This may be done every 3 years starting at age 56. ? Starting at age 79, this may be done every 5 years if you have a Pap test in combination with an HPV test. Other tests  STD (sexually transmitted disease) testing, if you are at risk.  Bone density scan. This is done to screen for osteoporosis. You may have this scan if you are at high risk for osteoporosis. Talk with your health care provider about your test results, treatment options, and if necessary, the need for more tests. Follow these instructions  at home: Eating and drinking  Eat a diet that includes fresh fruits and vegetables, whole grains, lean protein, and low-fat dairy products.  Take vitamin and mineral supplements as recommended by your health care provider.  Do not drink alcohol if: ? Your health care provider tells you not to drink. ? You are pregnant, may be pregnant, or are planning to become pregnant.  If you drink alcohol: ? Limit how much you have to 0-1 drink a  day. ? Be aware of how much alcohol is in your drink. In the [place] one drink equals one 12 oz bottle of beer (355 mL), one 5 oz glass of wine (148 mL), or one 1 oz glass of hard liquor (44 mL).   Lifestyle  Take daily care of your teeth and gums. Brush your teeth every morning and night with fluoride toothpaste. Floss one time each day.  Stay active. Exercise for at least 30 minutes 5 or more days each week.  Do not use any products that contain nicotine or tobacco, such as cigarettes, e-cigarettes, and chewing tobacco. If you need help quitting, ask your health care provider.  Do not use drugs.  If you are sexually active, practice safe sex. Use a condom or other form of protection to prevent STIs (sexually transmitted infections).  If you do not wish to become pregnant, use a form of birth control. If you plan to become pregnant, see your health care provider for a prepregnancy visit.  If told by your health care provider, take low-dose aspirin daily starting at age 23.  Find healthy ways to cope with stress, such as: ? Meditation, yoga, or listening to music. ? Journaling. ? Talking to a trusted person. ? Spending time with friends and family. Safety  Always wear your seat belt while driving or riding in a vehicle.  Do not drive: ? If you have been drinking alcohol. Do not ride with someone who has been drinking. ? When you are tired or distracted. ? While texting.  Wear a helmet and other protective equipment during sports activities.  If you have firearms in your house, make sure you follow all gun safety procedures. What's next?  Visit your health care provider once a year for an annual wellness visit.  Ask your health care provider how often you should have your eyes and teeth checked.  Stay up to date on all vaccines. This information is not intended to replace advice given to you by your health care provider. Make sure you discuss any questions you have with your  health care provider. Document Revised: 03/10/2020 Document Reviewed: 02/15/2018 Elsevier Patient Education  2021 [employer].

## 2020-10-30 NOTE — Assessment & Plan Note (Signed)
Well controlled in the office today amlodipine 5 mg, continue same.   Labs pending.

## 2020-10-30 NOTE — Assessment & Plan Note (Signed)
Declines all vaccines despite recommendations. Mammogram UTD. Pap smear UTD. Colonoscopy UTD, due in 2025.  Discussed the importance of a healthy diet and regular exercise in order for weight loss, and to reduce the risk of any potential medical problems.  Exam today negative. Labs pending.

## 2020-10-30 NOTE — Progress Notes (Signed)
Subjective:    Patient ID: Melody Hicks, female    DOB: 1960/11/14, 60 y.o.   MRN: [id number]  HPI  Melody Hicks is a very pleasant 60 y.o. female who presents today for complete physical.  Immunizations: -Tetanus: 2015 -Influenza: Declines  -Covid-19: Did not receive  -Shingles: Declines  Diet: She endorses a healthy diet.  Exercise: She is not exercising regularly  Eye exam: Completes annually  Dental exam: Completes semi-annually   Pap Smear: 2021 Mammogram: November 2021 Colonoscopy: Completed in 2015  BP Readings from Last 3 Encounters:  10/30/20 128/82  06/15/20 137/80  09/24/19 130/82       Review of Systems  Constitutional: Negative for unexpected weight change.  HENT: Negative for rhinorrhea.   Eyes: Negative for visual disturbance.  Respiratory: Negative for cough and shortness of breath.   Cardiovascular: Negative for chest pain.  Gastrointestinal: Negative for constipation and diarrhea.  Genitourinary: Negative for difficulty urinating.  Musculoskeletal: Negative for arthralgias and myalgias.  Skin: Negative for rash.  Allergic/Immunologic: Negative for environmental allergies.  Neurological: Negative for dizziness, numbness and headaches.  Psychiatric/Behavioral: The patient is not nervous/anxious.          Past Medical History:  Diagnosis Date  . Anxiety   . Dysuria 09/24/2018  . Herniated disc, cervical   . Hypertension   . Right facial swelling 08/06/2019    Social History   Socioeconomic History  . Marital status: Single    Spouse name: Not on file  . Number of children: Not on file  . Years of education: Not on file  . Highest education level: Not on file  Occupational History  . Not on file  Tobacco Use  . Smoking status: Former Games developer  . Smokeless tobacco: Never Used  Vaping Use  . Vaping Use: Never used  Substance and Sexual Activity  . Alcohol use: Not Currently  . Drug use: Never  . Sexual activity: Not  Currently  Other Topics Concern  . Not on file  Social History Narrative   Single.   1 son. 1 grandchild.   Working part time.   Social Determinants of Health   Financial Resource Strain: Not on file  Food Insecurity: Not on file  Transportation Needs: Not on file  Physical Activity: Not on file  Stress: Not on file  Social Connections: Not on file  Intimate Partner Violence: Not on file    Past Surgical History:  Procedure Laterality Date  . LYMPH NODE BIOPSY      Family History  Problem Relation Age of Onset  . Diabetes Mother   . Stroke Mother   . Hypertension Mother   . Heart disease Mother   . Hypertension Father   . Stroke Father     Allergies  Allergen Reactions  . Latex Itching  . Lisinopril Other (See Comments)    Other reaction(s): Hypotension Low bp,   . Iodinated Diagnostic Agents Nausea And Vomiting    Current Outpatient Medications on File Prior to Visit  Medication Sig Dispense Refill  . amLODipine (NORVASC) 5 MG tablet Take 1 tablet (5 mg total) by mouth daily. For blood pressure. 30 tablet 0  . ascorbic acid (VITAMIN C) 500 MG tablet Take 1,000 mg by mouth.    Marland Kitchen aspirin 81 MG EC tablet Take by mouth.    . Cholecalciferol 100 MCG (4000 UT) CAPS Take 4,000 Int'l Units by mouth daily.    Marland Kitchen DIGESTIVE ENZYMES PO Take by mouth.    Marland Kitchen  Omega-3 Fatty Acids (FISH OIL) 1000 MG CAPS Take by mouth.    . TURMERIC CURCUMIN PO Take by mouth.    . vitamin B-12 (CYANOCOBALAMIN) 1000 MCG tablet Take 1,000 mcg by mouth daily.    Marland Kitchen zinc gluconate 50 MG tablet Take 50 mg by mouth daily.     No current facility-administered medications on file prior to visit.    BP 128/82   Pulse 82   Temp 98.2 F (36.8 C) (Temporal)   Ht 5\' 4"  (1.626 m)   Wt 126 lb (57.2 kg)   SpO2 99%   BMI 21.63 kg/m  Objective:   Physical Exam HENT:     Right Ear: Tympanic membrane and ear canal normal.     Left Ear: Tympanic membrane and ear canal normal.     Nose: Nose normal.   Eyes:     Conjunctiva/sclera: Conjunctivae normal.     Pupils: Pupils are equal, round, and reactive to light.  Neck:     Thyroid: No thyromegaly.  Cardiovascular:     Rate and Rhythm: Normal rate and regular rhythm.     Heart sounds: No murmur heard.   Pulmonary:     Effort: Pulmonary effort is normal.     Breath sounds: Normal breath sounds. No rales.  Abdominal:     General: Bowel sounds are normal.     Palpations: Abdomen is soft.     Tenderness: There is no abdominal tenderness.  Musculoskeletal:        General: Normal range of motion.     Cervical back: Neck supple.  Lymphadenopathy:     Cervical: No cervical adenopathy.  Skin:    General: Skin is warm and dry.     Findings: No rash.  Neurological:     Mental Status: She is alert and oriented to person, place, and time.     Cranial Nerves: No cranial nerve deficit.     Deep Tendon Reflexes: Reflexes are normal and symmetric.           Assessment & Plan:      This visit occurred during the SARS-CoV-2 public health emergency.  Safety protocols were in place, including screening questions prior to the visit, additional usage of staff PPE, and extensive cleaning of exam room while observing appropriate contact time as indicated for disinfecting solutions.

## 2020-12-28 ENCOUNTER — Telehealth: Payer: Self-pay

## 2020-12-28 NOTE — Telephone Encounter (Signed)
Pt called stating she had STD labs drawn at a [facility] draw station on 12/24/2020.  Pt states HSV came back positive and states she is unsure why this has never been ordered at her annual exam. I explained to pt that we do not order HSV labs unless pt has an active lesion.   Pt is requesting repeat HSV blood test. Per Dr. Macon Large we will not repeat blood test unless pt has an active lesion. Pt denies ever having lesion and does not understand results. I offered appt with provider so this can be further discussed and pt declines.

## 2020-12-29 ENCOUNTER — Encounter: Payer: Self-pay | Admitting: Radiology

## 2021-04-29 ENCOUNTER — Encounter: Payer: Self-pay | Admitting: Radiology

## 2021-05-20 ENCOUNTER — Other Ambulatory Visit: Payer: Self-pay | Admitting: Primary Care

## 2021-05-20 DIAGNOSIS — Z1231 Encounter for screening mammogram for malignant neoplasm of breast: Secondary | ICD-10-CM

## 2021-07-07 ENCOUNTER — Encounter: Payer: Self-pay | Admitting: Primary Care

## 2021-07-07 ENCOUNTER — Ambulatory Visit (INDEPENDENT_AMBULATORY_CARE_PROVIDER_SITE_OTHER)
Admission: RE | Admit: 2021-07-07 | Discharge: 2021-07-07 | Disposition: A | Discharge status: Home / Self Care | Payer: Federal, State, Local not specified - PPO | Source: Ambulatory Visit | Attending: Primary Care | Admitting: Primary Care

## 2021-07-07 ENCOUNTER — Telehealth: Payer: Self-pay

## 2021-07-07 ENCOUNTER — Other Ambulatory Visit: Payer: Self-pay

## 2021-07-07 ENCOUNTER — Ambulatory Visit (INDEPENDENT_AMBULATORY_CARE_PROVIDER_SITE_OTHER): Payer: Federal, State, Local not specified - PPO | Admitting: Primary Care

## 2021-07-07 VITALS — BP 128/62 | HR 86 | Temp 98.0°F | Resp 12 | Ht 64.0 in | Wt 121.4 lb

## 2021-07-07 DIAGNOSIS — M25561 Pain in right knee: Secondary | ICD-10-CM

## 2021-07-07 DIAGNOSIS — M23209 Derangement of unspecified meniscus due to old tear or injury, unspecified knee: Secondary | ICD-10-CM | POA: Insufficient documentation

## 2021-07-07 DIAGNOSIS — G8929 Other chronic pain: Secondary | ICD-10-CM | POA: Insufficient documentation

## 2021-07-07 NOTE — Progress Notes (Signed)
Subjective:    Patient ID: Melody Hicks, female    DOB: 04/15/1961, 61 y.o.   MRN: [id number]  HPI  Melody Hicks is a very pleasant 61 y.o. female with a history of hypertension, right shoulder pain who presents today to discuss knee pain.  Her pain is located to the right upper calf through the popliteal fossa and lower thigh which began two weeks ago. Symptoms are intermittent.   She works at [employer], twists/bends/moves her body two days weekly, 8 hours per shift. Has done so since 2020. She doesn't remember injuring herself at work, but does remember hitting her knee one week ago at home "which set it off again".   [place], was walking upstairs, and her knee buckled. She had to hold the hand rail, limped half way up the stairs, turned around went back downstairs (limped), sat down. She was able walk out of the courthouse, drive her car here, and walk in today.   Symptoms are better when she's off work, worse during and after she's worked.   She denies calf swelling, redness, warmth, tenderness, long travel, sitting for long periods of time.   She's taken Aleve gel caps and used analgesic gel with improvement. She's been using an ACE wrap. She is requesting a note for work for rest, and upon return, light duty, not lifting boxes over 10 pounds, for four weeks.    Review of Systems  Cardiovascular:  Negative for leg swelling.  Musculoskeletal:  Positive for arthralgias. Negative for back pain and joint swelling.  Skin:  Negative for color change.        Past Medical History:  Diagnosis Date   Anxiety    Dysuria 09/24/2018   Herniated disc, cervical    Hypertension    Right facial swelling 08/06/2019    Social History   Socioeconomic History   Marital status: Single    Spouse name: Not on file   Number of children: Not on file   Years of education: Not on file   Highest education level: Not on file  Occupational History    Not on file  Tobacco Use   Smoking status: Former   Smokeless tobacco: Never  Vaping Use   Vaping Use: Never used  Substance and Sexual Activity   Alcohol use: Not Currently   Drug use: Never   Sexual activity: Not Currently  Other Topics Concern   Not on file  Social History Narrative   Single.   1 son. 1 grandchild.   Working part time.   Social Determinants of Health   Financial Resource Strain: Not on file  Food Insecurity: Not on file  Transportation Needs: Not on file  Physical Activity: Not on file  Stress: Not on file  Social Connections: Not on file  Intimate Partner Violence: Not on file    Past Surgical History:  Procedure Laterality Date   LYMPH NODE BIOPSY      Family History  Problem Relation Age of Onset   Diabetes Mother    Stroke Mother    Hypertension Mother    Heart disease Mother    Hypertension Father    Stroke Father     Allergies  Allergen Reactions   Latex Itching   Lisinopril Other (See Comments)    Other reaction(s): Hypotension Low bp,    Iodinated Contrast Media Nausea And Vomiting    Current Outpatient Medications on File Prior to Visit  Medication Sig  Dispense Refill   amLODipine (NORVASC) 5 MG tablet Take 1 tablet (5 mg total) by mouth daily. For blood pressure. 90 tablet 3   ascorbic acid (VITAMIN C) 500 MG tablet Take 1,000 mg by mouth.     aspirin 81 MG EC tablet Take by mouth.     Cholecalciferol 100 MCG (4000 UT) CAPS Take 4,000 Int'l Units by mouth daily.     DIGESTIVE ENZYMES PO Take by mouth.     Omega-3 Fatty Acids (FISH OIL) 1000 MG CAPS Take by mouth.     OVER THE COUNTER MEDICATION Sea moss     TURMERIC CURCUMIN PO Take by mouth.     vitamin B-12 (CYANOCOBALAMIN) 1000 MCG tablet Take 1,000 mcg by mouth daily.     zinc gluconate 50 MG tablet Take 50 mg by mouth daily.     No current facility-administered medications on file prior to visit.    BP 128/62    Pulse 86    Temp 98 F (36.7 C)    Resp 12    Ht 5'  4" (1.626 m)    Wt 121 lb 6 oz (55.1 kg)    SpO2 99%    BMI 20.83 kg/m  Objective:   Physical Exam Musculoskeletal:     Right knee: No swelling, deformity, erythema, lacerations or bony tenderness. Normal range of motion. No tenderness. Normal alignment and normal patellar mobility.       Legs:     Comments: 5/5 strength to bilateral lower extremities. Ambulates with limp to right side.   Neurological:     Mental Status: She is alert.          Assessment & Plan:      This visit occurred during the SARS-CoV-2 public health emergency.  Safety protocols were in place, including screening questions prior to the visit, additional usage of staff PPE, and extensive cleaning of exam room while observing appropriate contact time as indicated for disinfecting solutions.

## 2021-07-07 NOTE — Assessment & Plan Note (Signed)
Suspect arthritis/MSK. No obvious deformity or laxity.   Checking xray of right knee today.  Continue use of ACE bandage, Aleve, topical pain gel.  Work note provided today for 2 weeks of leave, then 4 weeks of light duty.   Referral placed for PT.

## 2021-07-07 NOTE — Telephone Encounter (Signed)
Pt has already been arrived for 3:40 appt on 07/07/21 with Allayne Gitelman NP. Sending note to Allayne Gitelman NP and [facility] CMA.

## 2021-07-07 NOTE — Telephone Encounter (Signed)
[facility] - Client TELEPHONE ADVICE RECORD [facility] Patient Name: Melody Hicks Gender: Female DOB: 02-Jul-1960 Age: 61 Y 9 M 23 D Return Phone Number: [phone] (Primary) Address: City/ State/ Zip: [place] [address] Client [facility] - Client Client Site [facility] - Day Provider Vernona Rieger - NP Contact Type Call Who Is Calling Patient / Member / Family / Caregiver Call Type Triage / Clinical Relationship To Patient Self Return Phone Number [phone] (Primary) Chief Complaint Leg Pain Reason for Call Symptomatic / Request for Health Information Initial Comment Caller states she is having leg pain in her right leg. She thought it was a pulled muscle. It is hurting her calf to her knee. This has been going on for 2 weeks. She is limping. Translation No Nurse Assessment Nurse: Iona Coach, RN, Christina Date/Time (Eastern Time): 07/07/2021 2:53:43 PM Confirm and document reason for call. If symptomatic, describe symptoms. ---Caller states she is having calf pain and behind her knee in her right leg. She thought it was a pulled muscle as she did bump her knee not to long ago. It is hurting her calf to her knee. It gave away while she was walking upstairs. This has been going on for 2 weeks but comes and goes. She is limping and mild swelling. Pt has put icy hot on it, wrapped it with ace wrap, taken Aleve. Denies any other symptoms. Pain 07/10 Does the patient have any new or worsening symptoms? ---Yes Will a triage be completed? ---Yes Related visit to physician within the last 2 weeks? ---No Does the PT have any chronic conditions? (i.e. diabetes, asthma, this includes High risk factors for pregnancy, etc.) ---Yes List chronic conditions. ---amlodipine 5mg  Is this a behavioral health or substance abuse call? ---No Guidelines Guideline Title Affirmed Question Affirmed Notes  Nurse Date/Time (Eastern Time) Knee Pain [1] Thigh or calf pain AND [2] only 1 side , RN, Iona Coach 07/07/2021 2:58:30 PM PLEASE NOTE: All timestamps contained within this report are represented as 07/09/2021 Standard Time. CONFIDENTIALTY NOTICE: This fax transmission is intended only for the addressee. It contains information that is legally privileged, confidential or otherwise protected from use or disclosure. If you are not the intended recipient, you are strictly prohibited from reviewing, disclosing, copying using or disseminating any of this information or taking any action in reliance on or regarding this information. If you have received this fax in error, please notify [place] immediately by telephone so that we can arrange for its return to [place]. Phone: [phone], Toll-Free: [phone], Fax: [phone] Page: 2 of 2 Call Id: [phone] Guidelines Guideline Title Affirmed Question Affirmed Notes Nurse Date/Time [id number] Time) AND [3] present > 1 hour Disp. Time Lamount Cohen Time) Disposition Final User 07/07/2021 3:10:58 PM See HCP within 4 Hours (or PCP triage) Yes 07/09/2021, RN, Iona Coach Disagree/Comply Comply Caller Understands Yes PreDisposition Did not know what to do Care Advice Given Per Guideline SEE HCP (OR PCP TRIAGE) WITHIN 4 HOURS: * IF OFFICE WILL BE OPEN: You need to be seen within the next 3 or 4 hours. Call your doctor (or NP/PA) now or as soon as the office opens. CARE ADVICE given per Knee Pain (Adult) guideline. Comments User: Arleta Creek, RN Date/Time Raina Mina Time): 07/07/2021 3:11:42 PM Pt warm transferred to the back line. Appt made for 340pm. Referrals Warm transfer to backlin

## 2021-07-07 NOTE — Telephone Encounter (Signed)
Patient evaluated.  

## 2021-07-07 NOTE — Patient Instructions (Signed)
Complete xray(s) prior to leaving today. I will notify you of your results once received.  Use the ACE wrap for support.  Continue Aleve and topical medication as needed.  You will be contacted regarding your referral to physical therapy.  Please let us know if you have not been contacted within two weeks.   It was a pleasure to see you today!

## 2021-07-16 DIAGNOSIS — M2241 Chondromalacia patellae, right knee: Secondary | ICD-10-CM | POA: Diagnosis not present

## 2021-07-26 ENCOUNTER — Encounter: Payer: Self-pay | Admitting: Obstetrics & Gynecology

## 2021-07-26 ENCOUNTER — Ambulatory Visit (INDEPENDENT_AMBULATORY_CARE_PROVIDER_SITE_OTHER): Payer: Federal, State, Local not specified - PPO | Admitting: Obstetrics & Gynecology

## 2021-07-26 ENCOUNTER — Other Ambulatory Visit: Payer: Self-pay

## 2021-07-26 VITALS — BP 147/78 | Ht 64.0 in | Wt 123.0 lb

## 2021-07-26 DIAGNOSIS — Z01419 Encounter for gynecological examination (general) (routine) without abnormal findings: Secondary | ICD-10-CM | POA: Diagnosis not present

## 2021-07-26 DIAGNOSIS — Z113 Encounter for screening for infections with a predominantly sexual mode of transmission: Secondary | ICD-10-CM | POA: Diagnosis not present

## 2021-07-26 NOTE — Progress Notes (Addendum)
GYNECOLOGY ANNUAL PREVENTATIVE CARE ENCOUNTER NOTE  History:     Melody Hicks is a 61 y.o. G63P1011 female here for a routine annual gynecologic exam.  Current complaints: none.   Denies abnormal vaginal bleeding, discharge, pelvic pain, problems with intercourse or other gynecologic concerns.    Gynecologic History No LMP recorded. Patient is postmenopausal. Last Pap: 06/15/2020. Result was normal with negative HPV Last Mammogram: 05/04/2020.  Result was normal Last Colonoscopy: 03/09/2014.  Result was normal  Obstetric History OB History  Gravida Para Term Preterm AB Living  2 1 1   1 1   SAB IAB Ectopic Multiple Live Births  1       1    # Outcome Date GA Lbr Len/2nd Weight Sex Delivery Anes PTL Lv  2 SAB 2001          1 Term 1989    M    LIV    Past Medical History:  Diagnosis Date   Anxiety    Herniated disc, cervical    Hypertension    Right facial swelling 08/06/2019    Past Surgical History:  Procedure Laterality Date   LYMPH NODE BIOPSY      Current Outpatient Medications on File Prior to Visit  Medication Sig Dispense Refill   amLODipine (NORVASC) 5 MG tablet Take 1 tablet (5 mg total) by mouth daily. For blood pressure. 90 tablet 3   ascorbic acid (VITAMIN C) 500 MG tablet Take 1,000 mg by mouth.     aspirin 81 MG EC tablet Take by mouth.     Cholecalciferol 100 MCG (4000 UT) CAPS Take 4,000 Int'l Units by mouth daily.     DIGESTIVE ENZYMES PO Take by mouth.     meloxicam (MOBIC) 15 MG tablet Take 15 mg by mouth daily.     Omega-3 Fatty Acids (FISH OIL) 1000 MG CAPS Take by mouth.     OVER THE COUNTER MEDICATION Sea moss     TURMERIC CURCUMIN PO Take by mouth.     vitamin B-12 (CYANOCOBALAMIN) 1000 MCG tablet Take 1,000 mcg by mouth daily.     zinc gluconate 50 MG tablet Take 50 mg by mouth daily.     No current facility-administered medications on file prior to visit.    Allergies  Allergen Reactions   Latex Itching   Lisinopril Other (See  Comments)    Other reaction(s): Hypotension Low bp,    Iodinated Contrast Media Nausea And Vomiting    Social History:  reports that she has quit smoking. She has never used smokeless tobacco. She reports that she does not currently use alcohol. She reports that she does not use drugs.  Family History  Problem Relation Age of Onset   Diabetes Mother    Stroke Mother    Hypertension Mother    Heart disease Mother    Hypertension Father    Stroke Father     The following portions of the patient's history were reviewed and updated as appropriate: allergies, current medications, past family history, past medical history, past social history, past surgical history and problem list.  Review of Systems Pertinent items noted in HPI and remainder of comprehensive ROS otherwise negative.  Physical Exam:  BP (!) 147/78   Ht 5\' 4"  (1.626 m)   Wt 123 lb (55.8 kg)   BMI 21.11 kg/m  CONSTITUTIONAL: Well-developed, well-nourished female in no acute distress.  HENT:  Normocephalic, atraumatic, External right and left ear normal.  EYES: Conjunctivae and  EOM are normal. Pupils are equal, round, and reactive to light. No scleral icterus.  NECK: Normal range of motion, supple, no masses.  Normal thyroid.  SKIN: Skin is warm and dry. No rash noted. Not diaphoretic. No erythema. No pallor. MUSCULOSKELETAL: Normal range of motion. No tenderness.  No cyanosis, clubbing, or edema. NEUROLOGIC: Alert and oriented to person, place, and time. Normal reflexes, muscle tone coordination.  PSYCHIATRIC: Normal mood and affect. Normal behavior. Normal judgment and thought content. CARDIOVASCULAR: Normal heart rate noted, regular rhythm RESPIRATORY: Clear to auscultation bilaterally. Effort and breath sounds normal, no problems with respiration noted. BREASTS: Symmetric in size. No masses, tenderness, skin changes, nipple drainage, or lymphadenopathy bilaterally. Performed in the presence of a chaperone. ABDOMEN:  Soft, no distention noted.  No tenderness, rebound or guarding.  PELVIC: Deferred.   Assessment and Plan:    1. Well woman exam with routine gynecological exam - RPR+HBsAg+HCVAb+HIV Serum STI screening done as per request, declined urine-vaginal screen for others. Pap, mammogram and colon cancer screening are up to date. Routine preventative health maintenance measures emphasized. Please refer to After Visit Summary for other counseling recommendations.      Jaynie Collins, MD, FACOG Obstetrician & Gynecologist, [facility] for [employer], [facility]

## 2021-07-27 LAB — RPR+HBSAG+HCVAB+...
HIV Screen 4th Generation wRfx: NONREACTIVE
Hep C Virus Ab: <0.1 s/co ratio (ref 0.0–0.9)
Hepatitis B Surface Ag: NEGATIVE
RPR Ser Ql: NONREACTIVE

## 2021-08-26 ENCOUNTER — Other Ambulatory Visit: Payer: Self-pay

## 2021-08-26 ENCOUNTER — Ambulatory Visit
Admission: RE | Admit: 2021-08-26 | Discharge: 2021-08-26 | Disposition: A | Discharge status: Home / Self Care | Payer: Federal, State, Local not specified - PPO | Source: Ambulatory Visit | Attending: Primary Care | Admitting: Primary Care

## 2021-08-26 DIAGNOSIS — Z1231 Encounter for screening mammogram for malignant neoplasm of breast: Secondary | ICD-10-CM | POA: Diagnosis not present

## 2021-09-16 DIAGNOSIS — G8929 Other chronic pain: Secondary | ICD-10-CM

## 2021-09-16 NOTE — Telephone Encounter (Signed)
You need to see her in office right? She sounded upset in message so I did not want to just send one asking for her to come in for follow up.  ?

## 2021-09-28 NOTE — Telephone Encounter (Signed)
Patient called need to be seen for note. Can only come in this week on thursday or Friday. Ok to add in the 340 or 1220?  ?

## 2021-09-30 NOTE — Telephone Encounter (Signed)
Joellen, can you print? ?

## 2021-10-01 ENCOUNTER — Encounter: Payer: Self-pay | Admitting: Obstetrics & Gynecology

## 2021-10-01 ENCOUNTER — Encounter: Payer: Self-pay | Admitting: Primary Care

## 2021-10-01 ENCOUNTER — Ambulatory Visit: Payer: Federal, State, Local not specified - PPO | Admitting: Primary Care

## 2021-10-01 DIAGNOSIS — M25561 Pain in right knee: Secondary | ICD-10-CM | POA: Diagnosis not present

## 2021-10-01 DIAGNOSIS — G8929 Other chronic pain: Secondary | ICD-10-CM | POA: Diagnosis not present

## 2021-10-01 NOTE — Assessment & Plan Note (Addendum)
Unclear etiology at this point. ? ?She could have some sort of meniscal or tendon tear.  Plain films of the knee reviewed from January 2023 which were negative for arthritis. ? ?We will proceed with MRI of the right knee as scheduled. ? ?Consider orthopedic evaluation if warranted. ? ?Approve job accommodation request, will complete form. ? ?30 minutes spent with patient completing forms, discussing symptoms, examining symptoms, reviewing x-ray. ?

## 2021-10-01 NOTE — Progress Notes (Signed)
? ?Subjective:  ? ? Patient ID: Melody Hicks, female    DOB: Sep 28, 1960, 61 y.o.   MRN: [id number] ? ?HPI ? ?Melody Hicks is a very pleasant 61 y.o. female with a history of hypertension, right knee pain, right shoulder pain who presents today to discuss ongoing chronic right knee pain and swelling. ? ?Symptom onset in early January 2023. No trauma prior to pain, but a few days after her pain began she hit her knee on a solid object. During her visit in January 2023 she underwent plain films of the knee which were negative. Evaluated by a chiropractor once who prescribed Meloxicam. She was put of work for 5 weeks, returned February 21st, symptoms improved. A few days prior to her return to work her knee pain returned.  ? ?Symptoms have waxed and waned, located to the right anterior and medial knee with a pulling sensation down the right lateral calf. Symptoms occur with rest, movement, pushing and lifting. She is employed by [employer], works 2 days weekly, two 4 hour shifts per day back to back.  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 ? ?Her knee pain becomes aggravated by carrying heavy loads, pushing heavy weights, squatting, kneeling, unloading trucks. She cannot unload non-con packages. She can stand but has noticed decrease in ROM to right knee with extension. ? ?She's been using KT tape, is taking Meloxicam daily with temporary improvement.  She has noticed swelling to the right knee, also weakness.  She denies falls and numbness. ? ?She presents today with forms for accomodation for her job. She will be going into a new role at work that should support these accommodations.  ? ?She has a MRI of her knee scheduled for 10/11/21.  ? ?Review of Systems  ?Musculoskeletal:  Positive for arthralgias and joint swelling.  ?Skin:  Negative for color change.  ?Neurological:  Positive for weakness. Negative for numbness.  ? ?   ? ? ?Past Medical  History:  ?Diagnosis Date  ? Anxiety   ? Dysuria 09/24/2018  ? Herniated disc, cervical   ? Hypertension   ? Right facial swelling 08/06/2019  ? ? ?Social History  ? ?Socioeconomic History  ? Marital status: Single  ?  Spouse name: Not on file  ? Number of children: Not on file  ? Years of education: Not on file  ? Highest education level: Not on file  ?Occupational History  ? Not on file  ?Tobacco Use  ? Smoking status: Former  ? Smokeless tobacco: Never  ?Vaping Use  ? Vaping Use: Never used  ?Substance and Sexual Activity  ? Alcohol use: Not Currently  ? Drug use: Never  ? Sexual activity: Not Currently  ?Other Topics Concern  ? Not on file  ?Social History Narrative  ? Single.  ? 1 son. 1 grandchild.  ? Working part time.  ? ?Social Determinants of Health  ? ?Financial Resource Strain: Not on file  ?Food Insecurity: Not on file  ?Transportation Needs: Not on file  ?Physical Activity: Not on file  ?Stress: Not on file  ?Social Connections: Not on file  ?Intimate Partner Violence: Not on file  ? ? ?Past Surgical History:  ?Procedure Laterality Date  ? LYMPH NODE BIOPSY    ? ? ?Family History  ?Problem Relation Age of Onset  ? Diabetes Mother   ? Stroke Mother   ? Hypertension Mother   ? Heart disease Mother   ? Hypertension  Father   ? Stroke Father   ? ? ?Allergies  ?Allergen Reactions  ? Latex Itching  ? Lisinopril Other (See Comments)  ?  Other reaction(s): Hypotension ?Low bp,   ? Iodinated Contrast Media Nausea And Vomiting  ? ? ?Current Outpatient Medications on File Prior to Visit  ?Medication Sig Dispense Refill  ? amLODipine (NORVASC) 5 MG tablet Take 1 tablet (5 mg total) by mouth daily. For blood pressure. 90 tablet 3  ? ascorbic acid (VITAMIN C) 500 MG tablet Take 1,000 mg by mouth.    ? aspirin 81 MG EC tablet Take by mouth.    ? Cholecalciferol 100 MCG (4000 UT) CAPS Take 4,000 Int'l Units by mouth daily.    ? DIGESTIVE ENZYMES PO Take by mouth.    ? meloxicam (MOBIC) 15 MG tablet Take 15 mg by mouth  daily.    ? Omega-3 Fatty Acids (FISH OIL) 1000 MG CAPS Take by mouth.    ? OVER THE COUNTER MEDICATION Sea moss    ? TURMERIC CURCUMIN PO Take by mouth.    ? vitamin B-12 (CYANOCOBALAMIN) 1000 MCG tablet Take 1,000 mcg by mouth daily.    ? zinc gluconate 50 MG tablet Take 50 mg by mouth daily.    ? ?No current facility-administered medications on file prior to visit.  ? ? ?BP 126/82   Pulse 87   Temp 98.6 ?F (37 ?C) (Oral)   Ht 5\' 4"  (1.626 m)   Wt 120 lb (54.4 kg)   SpO2 100%   BMI 20.60 kg/m?  ?Objective:  ? Physical Exam ?Constitutional:   ?   General: She is not in acute distress. ?Musculoskeletal:  ?   Right knee: Swelling present. No bony tenderness. Decreased range of motion.  ?   Left knee: No swelling or bony tenderness. Normal range of motion.  ?   Comments: 4 out of 5 strength to right lower extremity, 5 out of 5 strength to left lower extremity. ? ?Mild decrease in range of motion to right lower extremity with extension.  ?Skin: ?   General: Skin is warm and dry.  ? ? ? ? ? ?   ?Assessment & Plan:  ? ? ? ? ?This visit occurred during the SARS-CoV-2 public health emergency.  Safety protocols were in place, including screening questions prior to the visit, additional usage of staff PPE, and extensive cleaning of exam room while observing appropriate contact time as indicated for disinfecting solutions.  ?

## 2021-10-01 NOTE — Patient Instructions (Addendum)
Complete your MRI as scheduled. ? ?I will fax your forms.  ? ?It was a pleasure to see you today! ? ?

## 2021-10-01 NOTE — Telephone Encounter (Signed)
Printed and holding for appointment  ?

## 2021-10-04 ENCOUNTER — Encounter: Payer: Self-pay | Admitting: Obstetrics & Gynecology

## 2021-10-11 ENCOUNTER — Ambulatory Visit
Admission: RE | Admit: 2021-10-11 | Discharge: 2021-10-11 | Disposition: A | Discharge status: Home / Self Care | Payer: Federal, State, Local not specified - PPO | Source: Ambulatory Visit | Attending: Primary Care | Admitting: Primary Care

## 2021-10-11 DIAGNOSIS — M7121 Synovial cyst of popliteal space [Baker], right knee: Secondary | ICD-10-CM | POA: Diagnosis not present

## 2021-10-11 DIAGNOSIS — G8929 Other chronic pain: Secondary | ICD-10-CM | POA: Diagnosis not present

## 2021-10-11 DIAGNOSIS — S83241A Other tear of medial meniscus, current injury, right knee, initial encounter: Secondary | ICD-10-CM | POA: Diagnosis not present

## 2021-10-11 DIAGNOSIS — M7989 Other specified soft tissue disorders: Secondary | ICD-10-CM | POA: Diagnosis not present

## 2021-10-11 DIAGNOSIS — M25561 Pain in right knee: Secondary | ICD-10-CM | POA: Insufficient documentation

## 2021-10-11 DIAGNOSIS — M25461 Effusion, right knee: Secondary | ICD-10-CM | POA: Diagnosis not present

## 2021-10-15 ENCOUNTER — Ambulatory Visit: Payer: Federal, State, Local not specified - PPO | Admitting: Primary Care

## 2021-10-21 ENCOUNTER — Encounter: Payer: Self-pay | Admitting: Primary Care

## 2021-10-21 NOTE — Progress Notes (Signed)
Spoke with patient via phone for 16 minutes going over work accomodation form. ?Letter completed, printed and placed upfront for pickup at her convenience.  She is aware. ?

## 2021-10-28 NOTE — Telephone Encounter (Signed)
Noted, will evaluate as scheduled.  

## 2021-10-29 NOTE — Telephone Encounter (Signed)
Printed original and placed in your box  ?

## 2021-11-01 ENCOUNTER — Telehealth: Payer: Self-pay | Admitting: Primary Care

## 2021-11-01 NOTE — Telephone Encounter (Signed)
Pt called seeing if her accomodation paperwork for her job from [employer] was ready. She states "I am in no rush, just wanting to know when it will be ready, so I can come pick it up."  ? ?Callback Number: [phone] ?

## 2021-11-02 NOTE — Telephone Encounter (Signed)
See my chart message, I will be calling patient later this afternoon. ?

## 2021-11-11 ENCOUNTER — Ambulatory Visit (INDEPENDENT_AMBULATORY_CARE_PROVIDER_SITE_OTHER): Payer: Federal, State, Local not specified - PPO | Admitting: Primary Care

## 2021-11-11 ENCOUNTER — Encounter: Payer: Self-pay | Admitting: Primary Care

## 2021-11-11 VITALS — BP 122/68 | HR 82 | Temp 98.6°F | Ht 64.0 in | Wt 119.0 lb

## 2021-11-11 DIAGNOSIS — I10 Essential (primary) hypertension: Secondary | ICD-10-CM

## 2021-11-11 DIAGNOSIS — E559 Vitamin D deficiency, unspecified: Secondary | ICD-10-CM | POA: Diagnosis not present

## 2021-11-11 DIAGNOSIS — M25561 Pain in right knee: Secondary | ICD-10-CM

## 2021-11-11 DIAGNOSIS — G8929 Other chronic pain: Secondary | ICD-10-CM

## 2021-11-11 DIAGNOSIS — Z0000 Encounter for general adult medical examination without abnormal findings: Secondary | ICD-10-CM | POA: Diagnosis not present

## 2021-11-11 DIAGNOSIS — Z1152 Encounter for screening for COVID-19: Secondary | ICD-10-CM

## 2021-11-11 LAB — LIPID PANEL
Cholesterol: 170 mg/dL (ref 0–200)
HDL: 57.5 mg/dL (ref >39.00)
LDL Cholesterol: 99 mg/dL (ref 0–99)
NonHDL: 112.3
Total CHOL/HDL Ratio: 3
Triglycerides: 69 mg/dL (ref 0.0–149.0)
VLDL: 13.8 mg/dL (ref 0.0–40.0)

## 2021-11-11 LAB — CBC
HCT: 38.3 % (ref 36.0–46.0)
Hemoglobin: 12.3 g/dL (ref 12.0–15.0)
MCHC: 32.1 g/dL (ref 30.0–36.0)
MCV: 83.4 fl (ref 78.0–100.0)
Platelets: 310 10*3/uL (ref 150.0–400.0)
RBC: 4.6 Mil/uL (ref 3.87–5.11)
RDW: 14.3 % (ref 11.5–15.5)
WBC: 4 10*3/uL (ref 4.0–10.5)

## 2021-11-11 LAB — COMPREHENSIVE METABOLIC PANEL
ALT: 8 U/L (ref 0–35)
AST: 13 U/L (ref 0–37)
Albumin: 4.8 g/dL (ref 3.5–5.2)
Alkaline Phosphatase: 57 U/L (ref 39–117)
BUN: 12 mg/dL (ref 6–23)
CO2: 30 mEq/L (ref 19–32)
Calcium: 10 mg/dL (ref 8.4–10.5)
Chloride: 104 mEq/L (ref 96–112)
Creatinine, Ser: 0.63 mg/dL (ref 0.40–1.20)
GFR: 94.58 mL/min (ref >60.00)
Glucose, Bld: 83 mg/dL (ref 70–99)
Potassium: 4.2 mEq/L (ref 3.5–5.1)
Sodium: 143 mEq/L (ref 135–145)
Total Bilirubin: 0.5 mg/dL (ref 0.2–1.2)
Total Protein: 8 g/dL (ref 6.0–8.3)

## 2021-11-11 LAB — VITAMIN D 25 HYDROXY (VIT D DEFICIENCY, FRACTURES): VITD: 77.75 ng/mL (ref 30.00–100.00)

## 2021-11-11 LAB — SARS-COV-2 IGG: SARS-COV-2 IgG: 0.06

## 2021-11-11 NOTE — Assessment & Plan Note (Signed)
Declines Shingrix. Tetanus UTD. Pap smear UTD. Colonoscopy UTD, due 2025.  Discussed the importance of a healthy diet and regular exercise in order for weight loss, and to reduce the risk of further co-morbidity.  Exam stable. Labs pending.  Follow up in 1 year for repeat physical.

## 2021-11-11 NOTE — Assessment & Plan Note (Signed)
Controlled.  Continue amlodipine 5 mg daily. 

## 2021-11-11 NOTE — Patient Instructions (Addendum)
Stop by the lab prior to leaving today. I will notify you of your results once received.   It was a pleasure to see you today!  Preventive Care 78-63 Years Old, Female Preventive care refers to lifestyle choices and visits with your health care provider that can promote health and wellness. Preventive care visits are also called wellness exams. What can I expect for my preventive care visit? Counseling Your health care provider may ask you questions about your: Medical history, including: Past medical problems. Family medical history. Pregnancy history. Current health, including: Menstrual cycle. Method of birth control. Emotional well-being. Home life and relationship well-being. Sexual activity and sexual health. Lifestyle, including: Alcohol, nicotine or tobacco, and drug use. Access to firearms. Diet, exercise, and sleep habits. Work and work Statistician. Sunscreen use. Safety issues such as seatbelt and bike helmet use. Physical exam Your health care provider will check your: Height and weight. These may be used to calculate your BMI (body mass index). BMI is a measurement that tells if you are at a healthy weight. Waist circumference. This measures the distance around your waistline. This measurement also tells if you are at a healthy weight and may help predict your risk of certain diseases, such as type 2 diabetes and high blood pressure. Heart rate and blood pressure. Body temperature. Skin for abnormal spots. What immunizations do I need?  Vaccines are usually given at various ages, according to a schedule. Your health care provider will recommend vaccines for you based on your age, medical history, and lifestyle or other factors, such as travel or where you work. What tests do I need? Screening Your health care provider may recommend screening tests for certain conditions. This may include: Lipid and cholesterol levels. Diabetes screening. This is done by checking  your blood sugar (glucose) after you have not eaten for a while (fasting). Pelvic exam and Pap test. Hepatitis B test. Hepatitis C test. HIV (human immunodeficiency virus) test. STI (sexually transmitted infection) testing, if you are at risk. Lung cancer screening. Colorectal cancer screening. Mammogram. Talk with your health care provider about when you should start having regular mammograms. This may depend on whether you have a family history of breast cancer. BRCA-related cancer screening. This may be done if you have a family history of breast, ovarian, tubal, or peritoneal cancers. Bone density scan. This is done to screen for osteoporosis. Talk with your health care provider about your test results, treatment options, and if necessary, the need for more tests. Follow these instructions at home: Eating and drinking  Eat a diet that includes fresh fruits and vegetables, whole grains, lean protein, and low-fat dairy products. Take vitamin and mineral supplements as recommended by your health care provider. Do not drink alcohol if: Your health care provider tells you not to drink. You are pregnant, may be pregnant, or are planning to become pregnant. If you drink alcohol: Limit how much you have to 0-1 drink a day. Know how much alcohol is in your drink. In the [place] one drink equals one 12 oz bottle of beer (355 mL), one 5 oz glass of wine (148 mL), or one 1 oz glass of hard liquor (44 mL). Lifestyle Brush your teeth every morning and night with fluoride toothpaste. Floss one time each day. Exercise for at least 30 minutes 5 or more days each week. Do not use any products that contain nicotine or tobacco. These products include cigarettes, chewing tobacco, and vaping devices, such as e-cigarettes. If you need  help quitting, ask your health care provider. Do not use drugs. If you are sexually active, practice safe sex. Use a condom or other form of protection to prevent STIs. If you  do not wish to become pregnant, use a form of birth control. If you plan to become pregnant, see your health care provider for a prepregnancy visit. Take aspirin only as told by your health care provider. Make sure that you understand how much to take and what form to take. Work with your health care provider to find out whether it is safe and beneficial for you to take aspirin daily. Find healthy ways to manage stress, such as: Meditation, yoga, or listening to music. Journaling. Talking to a trusted person. Spending time with friends and family. Minimize exposure to UV radiation to reduce your risk of skin cancer. Safety Always wear your seat belt while driving or riding in a vehicle. Do not drive: If you have been drinking alcohol. Do not ride with someone who has been drinking. When you are tired or distracted. While texting. If you have been using any mind-altering substances or drugs. Wear a helmet and other protective equipment during sports activities. If you have firearms in your house, make sure you follow all gun safety procedures. Seek help if you have been physically or sexually abused. What's next? Visit your health care provider once a year for an annual wellness visit. Ask your health care provider how often you should have your eyes and teeth checked. Stay up to date on all vaccines. This information is not intended to replace advice given to you by your health care provider. Make sure you discuss any questions you have with your health care provider. Document Revised: 12/02/2020 Document Reviewed: 12/02/2020 Elsevier Patient Education  [place].

## 2021-11-11 NOTE — Progress Notes (Signed)
Subjective:    Patient ID: Melody Hicks, female    DOB: 1961/01/05, 61 y.o.   MRN: [id number]  HPI  Melody Hicks is a very pleasant 61 y.o. female who presents today for complete physical and follow up of chronic conditions.  She would also like Covid-19 antibody testing.  She continues to remain out of work as her employer has not accepted her work accommodations. She is ready to return to work. She continues to notice stiffness, swelling, and intermittent instability to her right knee. Overall no improvement. She is still thinking about physical therapy and orthopedic referral.   Intermittent itching to bilateral lower extremities with hyperpigmentation. Improved with application of a topical steroid.   Immunizations: -Tetanus: 2015 -Influenza: Did not complete last season  -Covid-19: Has not completed -Shingles: Has not completed, declines   Diet: Healthy diet.  Exercise: No regular exercise. Active  Eye exam: Completes annually  Dental exam: Completed this year.   Pap Smear: Completed in 2021 Mammogram: Completed in March 2023  Colonoscopy: Completed in 2015, due 2025  BP Readings from Last 3 Encounters:  11/11/21 122/68  10/01/21 126/82  07/26/21 (!) 147/78        Review of Systems  Constitutional:  Negative for unexpected weight change.  HENT:  Negative for rhinorrhea.   Eyes:  Negative for visual disturbance.  Respiratory:  Negative for cough and shortness of breath.   Cardiovascular:  Negative for chest pain.  Gastrointestinal:  Negative for constipation and diarrhea.  Genitourinary:  Negative for difficulty urinating.  Musculoskeletal:  Positive for arthralgias.  Skin:  Negative for rash.  Allergic/Immunologic: Negative for environmental allergies.  Neurological:  Negative for dizziness and headaches.  Psychiatric/Behavioral:  The patient is not nervous/anxious.         Past Medical History:  Diagnosis Date   Anxiety    Herniated disc,  cervical    Hypertension    Right facial swelling 08/06/2019    Social History   Socioeconomic History   Marital status: Single    Spouse name: Not on file   Number of children: Not on file   Years of education: Not on file   Highest education level: Not on file  Occupational History   Not on file  Tobacco Use   Smoking status: Former   Smokeless tobacco: Never  Vaping Use   Vaping Use: Never used  Substance and Sexual Activity   Alcohol use: Not Currently   Drug use: Never   Sexual activity: Not Currently  Other Topics Concern   Not on file  Social History Narrative   Single.   1 son. 1 grandchild.   Working part time.   Social Determinants of Health   Financial Resource Strain: Not on file  Food Insecurity: Not on file  Transportation Needs: Not on file  Physical Activity: Not on file  Stress: Not on file  Social Connections: Not on file  Intimate Partner Violence: Not on file    Past Surgical History:  Procedure Laterality Date   LYMPH NODE BIOPSY      Family History  Problem Relation Age of Onset   Diabetes Mother    Stroke Mother    Hypertension Mother    Heart disease Mother    Hypertension Father    Stroke Father     Allergies  Allergen Reactions   Latex Itching   Lisinopril Other (See Comments)    Other reaction(s): Hypotension Low bp,    Iodinated Contrast Media  Nausea And Vomiting    Current Outpatient Medications on File Prior to Visit  Medication Sig Dispense Refill   amLODipine (NORVASC) 5 MG tablet Take 1 tablet (5 mg total) by mouth daily. For blood pressure. 90 tablet 3   ascorbic acid (VITAMIN C) 500 MG tablet Take 1,000 mg by mouth.     aspirin 81 MG EC tablet Take by mouth.     Cholecalciferol 100 MCG (4000 UT) CAPS Take 4,000 Int'l Units by mouth daily.     DIGESTIVE ENZYMES PO Take by mouth.     meloxicam (MOBIC) 15 MG tablet Take 15 mg by mouth daily.     Omega-3 Fatty Acids (FISH OIL) 1000 MG CAPS Take by mouth.      OVER THE COUNTER MEDICATION Sea moss     TURMERIC CURCUMIN PO Take by mouth.     vitamin B-12 (CYANOCOBALAMIN) 1000 MCG tablet Take 1,000 mcg by mouth daily.     zinc gluconate 50 MG tablet Take 50 mg by mouth daily.     No current facility-administered medications on file prior to visit.    BP 122/68   Pulse 82   Temp 98.6 F (37 C) (Oral)   Ht 5\' 4"  (1.626 m)   Wt 119 lb (54 kg)   SpO2 99%   BMI 20.43 kg/m  Objective:   Physical Exam HENT:     Right Ear: Tympanic membrane and ear canal normal.     Left Ear: Tympanic membrane and ear canal normal.     Nose: Nose normal.  Eyes:     Conjunctiva/sclera: Conjunctivae normal.     Pupils: Pupils are equal, round, and reactive to light.  Neck:     Thyroid: No thyromegaly.  Cardiovascular:     Rate and Rhythm: Normal rate and regular rhythm.     Heart sounds: No murmur heard. Pulmonary:     Effort: Pulmonary effort is normal.     Breath sounds: Normal breath sounds. No rales.  Abdominal:     General: Bowel sounds are normal.     Palpations: Abdomen is soft.     Tenderness: There is no abdominal tenderness.  Musculoskeletal:        General: Normal range of motion.     Cervical back: Neck supple.  Lymphadenopathy:     Cervical: No cervical adenopathy.  Skin:    General: Skin is warm and dry.     Findings: No rash.  Neurological:     Mental Status: She is alert and oriented to person, place, and time.     Cranial Nerves: No cranial nerve deficit.     Deep Tendon Reflexes: Reflexes are normal and symmetric.  Psychiatric:        Mood and Affect: Mood normal.          Assessment & Plan:

## 2021-11-11 NOTE — Assessment & Plan Note (Addendum)
No improvement, ready to return to work with accommodations.  Offered physical therapy vs orthopedic referral, she declines.   I have approved her accommodations numerous times, her employer is still working on our request.   MRI revealed in April 2023: 1. Partial-thickness radial tear back near the meniscal root with partial detachment and medial protrusion of the meniscus estimated at 4 mm. 2. Intact ligamentous structures and no acute bony findings. 3. Mild medial compartment degenerative chondrosis. 4. Moderate to large joint effusion and small leaking Baker's cyst

## 2021-11-11 NOTE — Assessment & Plan Note (Signed)
Continue vitamin D 4000 IU daily. Repeat vitamin D level pending.

## 2021-11-27 ENCOUNTER — Other Ambulatory Visit: Payer: Self-pay | Admitting: Primary Care

## 2021-11-27 DIAGNOSIS — I10 Essential (primary) hypertension: Secondary | ICD-10-CM

## 2021-12-24 ENCOUNTER — Encounter: Payer: Self-pay | Admitting: Primary Care

## 2021-12-24 ENCOUNTER — Ambulatory Visit: Payer: Federal, State, Local not specified - PPO | Admitting: Primary Care

## 2021-12-24 DIAGNOSIS — G8929 Other chronic pain: Secondary | ICD-10-CM | POA: Diagnosis not present

## 2021-12-24 DIAGNOSIS — M25561 Pain in right knee: Secondary | ICD-10-CM | POA: Diagnosis not present

## 2021-12-24 NOTE — Progress Notes (Signed)
Subjective:    Patient ID: Melody Hicks, female    DOB: [birth year], 61 y.o.   MRN: [id number]  HPI  Melody Hicks is a very pleasant 61 y.o. female with a history of chronic right knee pain, right shoulder pain, hypertension who presents today to follow-up from work leave of absence.  Currently on work leave of absence for right knee pain and instability that began in January 2023.  During that time she was placed on leave for 5 weeks with a plan to return to 08/10/21.    Evaluated again on 10/01/2021 requesting work accommodation as she  re-injured her knee in March 2023, but she wanted to continue working. She works for [employer], 2 days weekly, two 4-hour shifts back-to-back.  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  Accommodation forms were completed.  She underwent MRI of the right knee on 10/12/2021 which revealed a "right partial-thickness radial tear back near the meniscal root with partial detachment and medial protrusion of the meniscus estimated at 4 mm.  Also with mild medial compartment degenerative chondrosis, moderate to large joint effusion and small leaking Baker's cyst".  Physical therapy and orthopedic referral were offered for which she declined.  Evaluated again on 11/11/2021, patient was ready to return to work with accommodations, however eventually her accommodation request was denied by her employer.  Again, she was offered orthopedic referral and/or physical therapy for her knee, she declined.  Today she endorses that she was provided options to return to work which included a "change in the weight limit" she's allowed or to go back to full duties. She asked about the "weight limit" for lifting/pushing/pulling and she was not provided an answer. She has not returned to work and does not plan on doing so as they will not accept our requested work accomodation. She has  not ended her employment with [employer] as she is waiting for either a better position or waiting for her knee to heal. She is needing a letter today stating that she may not return to work given her chronic knee pain.   Her knee is improving. She's able to almost fully extend her knee without pain when standing. She continues to experience a "pulling sensation" when standing. She is able to grocery shop and do household chores without difficulty. Long car rides are difficult as it will cause pressure and pulling to the knee. She is taking Tylenol once weekly as needed for pain and swelling. She is working on stretching exercises at home.    Review of Systems  Musculoskeletal:  Positive for arthralgias and joint swelling.  Skin:  Negative for color change.  Neurological:  Positive for weakness.         Past Medical History:  Diagnosis Date   Anxiety    Herniated disc, cervical    Hypertension    Right facial swelling 08/06/2019    Social History   Socioeconomic History   Marital status: Single    Spouse name: Not on file   Number of children: Not on file   Years of education: Not on file   Highest education level: Not on file  Occupational History   Not on file  Tobacco Use   Smoking status: Former   Smokeless tobacco: Never  Vaping Use   Vaping Use: Never used  Substance and Sexual Activity   Alcohol use: Not Currently   Drug use: Never  Sexual activity: Not Currently  Other Topics Concern   Not on file  Social History Narrative   Single.   1 son. 1 grandchild.   Working part time.   Social Determinants of Health   Financial Resource Strain: Not on file  Food Insecurity: Not on file  Transportation Needs: Not on file  Physical Activity: Not on file  Stress: Not on file  Social Connections: Not on file  Intimate Partner Violence: Not on file    Past Surgical History:  Procedure Laterality Date   LYMPH NODE BIOPSY      Family History  Problem Relation Age  of Onset   Diabetes Mother    Stroke Mother    Hypertension Mother    Heart disease Mother    Hypertension Father    Stroke Father     Allergies  Allergen Reactions   Latex Itching   Lisinopril Other (See Comments)    Other reaction(s): Hypotension Low bp,    Iodinated Contrast Media Nausea And Vomiting    Current Outpatient Medications on File Prior to Visit  Medication Sig Dispense Refill   amLODipine (NORVASC) 5 MG tablet Take 1 tablet by mouth once daily for blood pressure 90 tablet 3   ascorbic acid (VITAMIN C) 500 MG tablet Take 1,000 mg by mouth.     aspirin 81 MG EC tablet Take by mouth.     Cholecalciferol 100 MCG (4000 UT) CAPS Take 4,000 Int'l Units by mouth daily.     DIGESTIVE ENZYMES PO Take by mouth.     Omega-3 Fatty Acids (FISH OIL) 1000 MG CAPS Take by mouth.     OVER THE COUNTER MEDICATION Sea moss     TURMERIC CURCUMIN PO Take by mouth.     vitamin B-12 (CYANOCOBALAMIN) 1000 MCG tablet Take 1,000 mcg by mouth daily.     No current facility-administered medications on file prior to visit.    BP 124/62   Pulse 84   Temp 98.6 F (37 C) (Oral)   Ht 5\' 4"  (1.626 m)   Wt 124 lb (56.2 kg)   SpO2 99%   BMI 21.28 kg/m  Objective:   Physical Exam Musculoskeletal:     Right knee: Swelling present. No bony tenderness. Decreased range of motion. No tenderness. Normal alignment.       Legs:     Comments: Right anterior knee with mild swelling compared to left. No obvious Baker's cyst palpated to right posterior knee. Able to get up and down from exam table, but does lead with her left lower extremity. Continued reduced extension of right lower extremity, about 80 degrees while sitting on exam table. This is improved compared to prior visits.   Ambulates with slight limp to right lower extremity in office.           Assessment & Plan:   Problem List Items Addressed This Visit       Other   Chronic knee pain    Improving. Not fully recovered and  should not return to full duty without restrictions at work.  She remains out of work as her employer has rejected her work .  She kindly declines orthopedic evaluation and physical therapy.   Letter provided today as requested.            Doctor, general practice, NP

## 2021-12-24 NOTE — Assessment & Plan Note (Signed)
Improving. Not fully recovered and should not return to full duty without restrictions at work.  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  She kindly declines orthopedic evaluation and physical therapy.   Letter provided today as requested.

## 2021-12-24 NOTE — Patient Instructions (Signed)
It was a pleasure to see you today!   

## 2022-01-02 NOTE — Telephone Encounter (Signed)
Joellen, will you print the most recent letter I wrote to her employer? She wants to pick up a copy.

## 2022-03-14 DIAGNOSIS — H40023 Open angle with borderline findings, high risk, bilateral: Secondary | ICD-10-CM | POA: Diagnosis not present

## 2022-03-14 DIAGNOSIS — H40053 Ocular hypertension, bilateral: Secondary | ICD-10-CM | POA: Diagnosis not present

## 2022-03-14 DIAGNOSIS — H2513 Age-related nuclear cataract, bilateral: Secondary | ICD-10-CM | POA: Diagnosis not present

## 2022-03-17 ENCOUNTER — Encounter: Payer: Self-pay | Admitting: Primary Care

## 2022-03-17 ENCOUNTER — Ambulatory Visit: Payer: Federal, State, Local not specified - PPO | Admitting: Primary Care

## 2022-03-17 DIAGNOSIS — K115 Sialolithiasis: Secondary | ICD-10-CM

## 2022-03-17 HISTORY — DX: Sialolithiasis: K11.5

## 2022-03-17 NOTE — Progress Notes (Signed)
Subjective:    Patient ID: Melody Hicks, female    DOB: 10-11-1960, 61 y.o.   MRN: [id number]  HPI  Melody Hicks is a very pleasant 61 y.o. female with a history of hypertension, chronic knee pain, vitamin D deficiency who presents today to discuss neck swelling  Symptoms of left upper neck swelling and discomfort that occurred after five minutes while eating fried fish and tartar sauce at a [employer]. She began to feel a "sensation" and swelling to her left upper lateral neck below the jaw line. "My neck blew up like a blowfish." She then began to notice a significant increase in saliva for which she felt draining down the side of the inside of her face into her throat.  Her symptoms gradually improved until later that evening when she ate dinner (doesn't recall what she ate) when she experienced mild swelling and saliva production.  Swelling resolved the following day, but later that evening she noticed left lateal neck pain when eating again, no swelling.  The night before her symptom onset she was having difficulty sleeping due to increased saliva production and post nasal drip so she decided to take pseudoephedrine with triprolidine. Symptoms improved and she fell asleep within one hour.   She's had no symptoms in one week. She has eaten fried fish with tartar sauce numerous times in the same restaurant previously.   She's had these same symptoms occur twice before in her life, the last time was in February 2021 after eating spaghetti and meatballs. She experienced immediate right sided neck swelling under her right jaw with increased salivary production. She presented to the ED several hours later but her swelling had subsided and the ED physician was more focused on her elevated BP. No work up for her neck swelling was done.   Her first episode occurred >10 years prior.  She denies throat tightness, scratchy throat, wheezing, chest tightness, known food allergies.     Review of Systems  HENT:  Positive for postnasal drip. Negative for congestion, drooling, sore throat and trouble swallowing.        See  HPI  Respiratory:  Negative for cough, chest tightness, shortness of breath and wheezing.          Past Medical History:  Diagnosis Date   Anxiety    Herniated disc, cervical    Hypertension    Right facial swelling 08/06/2019    Social History   Socioeconomic History   Marital status: Single    Spouse name: Not on file   Number of children: Not on file   Years of education: Not on file   Highest education level: Not on file  Occupational History   Not on file  Tobacco Use   Smoking status: Former   Smokeless tobacco: Never  Vaping Use   Vaping Use: Never used  Substance and Sexual Activity   Alcohol use: Not Currently   Drug use: Never   Sexual activity: Not Currently  Other Topics Concern   Not on file  Social History Narrative   Single.   1 son. 1 grandchild.   Working part time.   Social Determinants of Health   Financial Resource Strain: Not on file  Food Insecurity: Not on file  Transportation Needs: Not on file  Physical Activity: Not on file  Stress: Not on file  Social Connections: Not on file  Intimate Partner Violence: Not on file    Past Surgical History:  Procedure Laterality Date  LYMPH NODE BIOPSY      Family History  Problem Relation Age of Onset   Diabetes Mother    Stroke Mother    Hypertension Mother    Heart disease Mother    Hypertension Father    Stroke Father     Allergies  Allergen Reactions   Latex Itching   Lisinopril Other (See Comments)    Other reaction(s): Hypotension Low bp,    Iodinated Contrast Media Nausea And Vomiting    Current Outpatient Medications on File Prior to Visit  Medication Sig Dispense Refill   amLODipine (NORVASC) 5 MG tablet Take 1 tablet by mouth once daily for blood pressure 90 tablet 3   ascorbic acid (VITAMIN C) 500 MG tablet Take 1,000 mg  by mouth.     aspirin 81 MG EC tablet Take by mouth.     Cholecalciferol 100 MCG (4000 UT) CAPS Take 4,000 Int'l Units by mouth daily.     DIGESTIVE ENZYMES PO Take by mouth.     Omega-3 Fatty Acids (FISH OIL) 1000 MG CAPS Take by mouth.     OVER THE COUNTER MEDICATION Sea moss     TURMERIC CURCUMIN PO Take by mouth.     vitamin B-12 (CYANOCOBALAMIN) 1000 MCG tablet Take 1,000 mcg by mouth daily.     No current facility-administered medications on file prior to visit.    BP 122/80   Pulse 82   Temp 97.9 F (36.6 C) (Temporal)   Ht 5\' 4"  (1.626 m)   Wt 126 lb (57.2 kg)   SpO2 99%   BMI 21.63 kg/m  Objective:   Physical Exam Constitutional:      General: She is not in acute distress.    Appearance: She is not ill-appearing.  Neck:     Thyroid: No thyroid mass or thyromegaly.  Cardiovascular:     Rate and Rhythm: Normal rate and regular rhythm.  Pulmonary:     Effort: Pulmonary effort is normal.     Breath sounds: Normal breath sounds. No wheezing.  Lymphadenopathy:     Cervical: No cervical adenopathy.  Neurological:     Mental Status: She is alert.           Assessment & Plan:   Problem List Items Addressed This Visit       Digestive   Salivary gland stone    Symptoms highly suspicious for salivary gland swelling secondary to stone, especially as symptoms occurred within minutes of eating. Do not suspect food allergy as she has eaten the same food at the same restaurant for years, also prior incident involved a different type of food.  Exam today benign. We discussed that if her symptoms return to notify me immediately.  We also discussed some home care instructions such as sucking on sour candy, increasing water intake, massaging the site of swelling.  Recommend obtaining ultrasound versus CT if symptoms return. Handout provided today.       30 minutes spent today with patient face-to-face to discuss symptoms and understand etiology.  See assessment and  plan notes.   , NP

## 2022-03-17 NOTE — Assessment & Plan Note (Signed)
Symptoms highly suspicious for salivary gland swelling secondary to stone, especially as symptoms occurred within minutes of eating. Do not suspect food allergy as she has eaten the same food at the same restaurant for years, also prior incident involved a different type of food.  Exam today benign. We discussed that if her symptoms return to notify me immediately.  We also discussed some home care instructions such as sucking on sour candy, increasing water intake, massaging the site of swelling.  Recommend obtaining ultrasound versus CT if symptoms return. Handout provided today.

## 2022-03-17 NOTE — Patient Instructions (Signed)
Notify me if your symptoms occur again.  If your symptoms return:  Suck on hard candy, apply warm compresses, massage the area.  It was a pleasure to see you today!   Salivary Stone  A salivary stone is a small cluster of mineral (mineral deposit) that builds up in the tubes (ducts) that drain the salivary glands. Most salivary stones are made of calcium. When a stone forms, saliva can back up into the gland and cause painful swelling. Your salivary glands are the glands that make saliva. You have six major salivary glands. Each gland has a duct that carries saliva into your mouth. Saliva keeps your mouth moist and breaks down the food that you eat. It also helps prevent tooth decay. Two salivary glands are found just in front of your ears (parotid). The ducts for these glands open up inside your cheeks, near your back teeth. You also have two glands under your tongue (sublingual) and two glands under your jaw (submandibular). The ducts for these glands open under your tongue. A stone can form in any salivary gland. The most common place for a salivary stone to form is in a submandibular salivary gland. What are the causes? Salivary stones may be caused by any condition that lessens the flow of saliva. It is not known why some people get stones. What increases the risk? You are more likely to develop this condition if: You do not drink enough water. You smoke. You have any of these: High blood pressure. Gout. Diabetes. What are the signs or symptoms? The main sign of a salivary stone is sudden swelling of a salivary gland during eating. This usually happens under the jaw on one side. Other signs and symptoms may include: Swelling of the cheek or under the tongue during eating. Pain in the swollen area. Trouble chewing or swallowing. Swelling that goes down after eating. Sometimes, the salivary stone may be seen. The stone is oval in shape and may be white or yellow in color. How is  this diagnosed? This condition may be diagnosed based on: Your signs and symptoms. A physical exam. In many cases, your health care provider will be able to feel the stone in a duct inside your mouth. Imaging studies, such as: X-rays. Ultrasound. CT scan. MRI. You may need to see an ear, nose, and throat specialist (ENT or otolaryngologist) for diagnosis and treatment. How is this treated? Treatment for this condition depends on the size of the stone. A small stone that is not causing symptoms may be treated with home care. A stone that is large enough to cause symptoms may be treated by: Probing and widening of the duct to let the stone pass. Putting a thin, flexible scope (endoscope) into the duct to find and remove the stone. Breaking up the stone with sound waves. Removing the whole salivary gland. Follow these instructions at home: To relieve discomfort Take NSAIDs, such as ibuprofen, to help relieve pain and swelling as told by your health care provider. Follow these instructions every few hours: Suck on a lemon candy or a vitamin C lozenge to prompt the flow of saliva. Put a warm, damp cloth (warmcompress) over the gland. Gently massage the gland. General instructions  Take over-the-counter and prescription medicines only as told by your health care provider. Drink enough fluid to keep your urine pale yellow. Do not use any products that contain nicotine or tobacco. These products include cigarettes, chewing tobacco, and vaping devices, such as e-cigarettes. If you need  help quitting, ask your health care provider. Keep all follow-up visits. This is important. Contact a health care provider if: You have pain and swelling in your face, jaw, or mouth after eating. You keep having swelling in any of these places: In front of your ear. Under your jaw. Inside your mouth. Get help right away if: You have pain and swelling in your face, jaw, or mouth, that suddenly gets  worse. Your pain and swelling make it hard to swallow, talk, or breathe. These symptoms may be an emergency. Get help right away. Call 911. Do not wait to see if the symptoms will go away. Do not drive yourself to the hospital. Summary A salivary stone is a small clump of mineral (mineral deposit) that builds up in the ducts that drain your salivary glands. When a stone forms, saliva can back up into the gland and cause painful swelling. Salivary stones may be caused by any condition that lessens the flow of saliva. Treatment for this condition depends on the size of the stone. This information is not intended to replace advice given to you by your health care provider. Make sure you discuss any questions you have with your health care provider. Document Revised: 06/02/2021 Document Reviewed: 06/02/2021 Elsevier Patient Education  [place].

## 2022-05-30 ENCOUNTER — Encounter: Payer: Self-pay | Admitting: Primary Care

## 2022-05-30 ENCOUNTER — Ambulatory Visit: Payer: Federal, State, Local not specified - PPO | Admitting: Primary Care

## 2022-05-30 VITALS — BP 148/84 | HR 93 | Temp 98.1°F | Ht 64.0 in | Wt 130.0 lb

## 2022-05-30 DIAGNOSIS — M23206 Derangement of unspecified meniscus due to old tear or injury, right knee: Secondary | ICD-10-CM | POA: Diagnosis not present

## 2022-05-30 NOTE — Assessment & Plan Note (Signed)
Improved gradually, not quite recovered.  Exam today with evidence of improved range of motion and improve in overall in physical movement. I agree that she isn't quite ready to return to work, especially since her occupation requires significant mobility, heavy lifting, and quick changes in movement.   Agree to extend medical leave through September 26, 2022 with return to work date of September 27, 2022.

## 2022-05-30 NOTE — Progress Notes (Signed)
Subjective:    Patient ID: Melody Hicks, female    DOB: Mar 10, 1961, 60 y.o.   MRN: [id number]  HPI  Vernella A Kosman is a very pleasant 61 y.o. female with a history of hypertension, chronic knee pain, vitamin D deficiency who presents today requesting extension of medical leave.   Currently on medical leave from [employer] for torn meniscus and chronic knee pain She was initially placed on medical leave on 07/07/21 after a two week prior history of right knee pain. She returned to work 5 weeks later, symptoms improved up on her return to work, but symptoms relapsed in late March 2023 when heavier boxes were sent to her for movement off the belt.   Further work up in early April 2023 revealed partial thickness radial tear back near the meniscal root with partial detachment and medial protrusion of the meniscus estimated at 4 mm. Also with moderate to large joint effusion with small leaking Baker's cyst.   She was offered physical therapy and orthopedic referral several times since April 2023 for which she kindly declined.   We attempted to return her to work with restrictions in July 2023, but her employer denied our requests so she remained out of work completely.    She recently received additional paperwork requesting an update on her medical leave. Today she doesn't feel completely ready to return to work but has noticed overall improvement to her right knee pain. She's able to fully extend her right lower extremity. She's also noticed improved stability overall, she can sometimes squat, she can lay onto her right side in bed without pain. She does continue to notice some buckling and instability to her right knee, some pain with right knee flexion in bed. She does have to rise slowly from bed due to cramping and stiffness.    She has been active with exercise and knee strengthening through home videos twice weekly since September 2023.   She would like to extend her medical leave through  September 26, 2022 with return to work on September 26, 2021.   Review of Systems  Musculoskeletal:  Positive for arthralgias and myalgias.  Neurological:  Negative for numbness.         Past Medical History:  Diagnosis Date   Anxiety    Herniated disc, cervical    Hypertension    Right facial swelling 08/06/2019    Social History   Socioeconomic History   Marital status: Single    Spouse name: Not on file   Number of children: Not on file   Years of education: Not on file   Highest education level: Not on file  Occupational History   Not on file  Tobacco Use   Smoking status: Former   Smokeless tobacco: Never  Vaping Use   Vaping Use: Never used  Substance and Sexual Activity   Alcohol use: Not Currently   Drug use: Never   Sexual activity: Not Currently  Other Topics Concern   Not on file  Social History Narrative   Single.   1 son. 1 grandchild.   Working part time.   Social Determinants of Health   Financial Resource Strain: Not on file  Food Insecurity: Not on file  Transportation Needs: Not on file  Physical Activity: Not on file  Stress: Not on file  Social Connections: Not on file  Intimate Partner Violence: Not on file    Past Surgical History:  Procedure Laterality Date   LYMPH NODE BIOPSY  Family History  Problem Relation Age of Onset   Diabetes Mother    Stroke Mother    Hypertension Mother    Heart disease Mother    Hypertension Father    Stroke Father     Allergies  Allergen Reactions   Latex Itching   Lisinopril Other (See Comments)    Other reaction(s): Hypotension Low bp,    Iodinated Contrast Media Nausea And Vomiting    Current Outpatient Medications on File Prior to Visit  Medication Sig Dispense Refill   amLODipine (NORVASC) 5 MG tablet Take 1 tablet by mouth once daily for blood pressure 90 tablet 3   ascorbic acid (VITAMIN C) 500 MG tablet Take 1,000 mg by mouth.     aspirin 81 MG EC tablet Take by mouth.      Cholecalciferol 100 MCG (4000 UT) CAPS Take 4,000 Int'l Units by mouth daily.     Omega-3 Fatty Acids (FISH OIL) 1000 MG CAPS Take by mouth.     OVER THE COUNTER MEDICATION Sea moss     TURMERIC CURCUMIN PO Take by mouth.     vitamin B-12 (CYANOCOBALAMIN) 1000 MCG tablet Take 1,000 mcg by mouth daily.     DIGESTIVE ENZYMES PO Take by mouth. (Patient not taking: Reported on 05/30/2022)     No current facility-administered medications on file prior to visit.    BP (!) 148/84   Pulse 93   Temp 98.1 F (36.7 C) (Temporal)   Ht 5\' 4"  (1.626 m)   Wt 130 lb (59 kg)   SpO2 99%   BMI 22.31 kg/m  Objective:   Physical Exam Musculoskeletal:     Right knee: No swelling or bony tenderness. Normal range of motion.     Left knee: No swelling or bony tenderness. Normal range of motion.     Comments: ROM to right knee with significant improvement. Able to get up and down from exam table without difficulty. No obvious stability to            Assessment & Plan:   Problem List Items Addressed This Visit       Musculoskeletal and Integument   Chronic meniscal tear of knee - Primary    Improved gradually, not quite recovered.  Exam today with evidence of improved range of motion and improve in overall in physical movement. I agree that she isn't quite ready to return to work, especially since her occupation requires significant mobility, heavy lifting, and quick changes in movement.   Agree to extend medical leave through September 26, 2022 with return to work date of September 27, 2022.          Pleas Koch, NP

## 2022-06-23 DIAGNOSIS — R319 Hematuria, unspecified: Secondary | ICD-10-CM | POA: Diagnosis not present

## 2022-06-23 DIAGNOSIS — R350 Frequency of micturition: Secondary | ICD-10-CM | POA: Diagnosis not present

## 2022-08-02 ENCOUNTER — Emergency Department (HOSPITAL_BASED_OUTPATIENT_CLINIC_OR_DEPARTMENT_OTHER)
Admission: EM | Admit: 2022-08-02 | Discharge: 2022-08-02 | Disposition: A | Discharge status: Home / Self Care | Payer: Federal, State, Local not specified - PPO | Attending: Emergency Medicine | Admitting: Emergency Medicine

## 2022-08-02 ENCOUNTER — Emergency Department (HOSPITAL_BASED_OUTPATIENT_CLINIC_OR_DEPARTMENT_OTHER): Payer: Federal, State, Local not specified - PPO

## 2022-08-02 ENCOUNTER — Other Ambulatory Visit: Payer: Self-pay

## 2022-08-02 ENCOUNTER — Other Ambulatory Visit (HOSPITAL_BASED_OUTPATIENT_CLINIC_OR_DEPARTMENT_OTHER): Payer: Self-pay

## 2022-08-02 DIAGNOSIS — R109 Unspecified abdominal pain: Secondary | ICD-10-CM

## 2022-08-02 DIAGNOSIS — Z91040 Latex allergy status: Secondary | ICD-10-CM | POA: Diagnosis not present

## 2022-08-02 DIAGNOSIS — R1032 Left lower quadrant pain: Secondary | ICD-10-CM | POA: Diagnosis not present

## 2022-08-02 DIAGNOSIS — K769 Liver disease, unspecified: Secondary | ICD-10-CM | POA: Diagnosis not present

## 2022-08-02 DIAGNOSIS — K429 Umbilical hernia without obstruction or gangrene: Secondary | ICD-10-CM | POA: Diagnosis not present

## 2022-08-02 DIAGNOSIS — Z79899 Other long term (current) drug therapy: Secondary | ICD-10-CM | POA: Insufficient documentation

## 2022-08-02 DIAGNOSIS — R1012 Left upper quadrant pain: Secondary | ICD-10-CM

## 2022-08-02 DIAGNOSIS — Z87891 Personal history of nicotine dependence: Secondary | ICD-10-CM | POA: Insufficient documentation

## 2022-08-02 DIAGNOSIS — N201 Calculus of ureter: Secondary | ICD-10-CM | POA: Diagnosis not present

## 2022-08-02 DIAGNOSIS — N200 Calculus of kidney: Secondary | ICD-10-CM | POA: Diagnosis not present

## 2022-08-02 DIAGNOSIS — R1111 Vomiting without nausea: Secondary | ICD-10-CM | POA: Diagnosis not present

## 2022-08-02 DIAGNOSIS — R1084 Generalized abdominal pain: Secondary | ICD-10-CM | POA: Diagnosis not present

## 2022-08-02 DIAGNOSIS — D259 Leiomyoma of uterus, unspecified: Secondary | ICD-10-CM | POA: Diagnosis not present

## 2022-08-02 LAB — COMPREHENSIVE METABOLIC PANEL
ALT: 34 U/L (ref 0–44)
AST: 31 U/L (ref 15–41)
Albumin: 4.4 g/dL (ref 3.5–5.0)
Alkaline Phosphatase: 58 U/L (ref 38–126)
Anion gap: 11 (ref 5–15)
BUN: 15 mg/dL (ref 8–23)
CO2: 26 mmol/L (ref 22–32)
Calcium: 9.2 mg/dL (ref 8.9–10.3)
Chloride: 103 mmol/L (ref 98–111)
Creatinine, Ser: 0.73 mg/dL (ref 0.44–1.00)
GFR, Estimated: >60 mL/min (ref >60)
Glucose, Bld: 98 mg/dL (ref 70–99)
Potassium: 3.7 mmol/L (ref 3.5–5.1)
Sodium: 140 mmol/L (ref 135–145)
Total Bilirubin: 0.7 mg/dL (ref 0.3–1.2)
Total Protein: 8.4 g/dL — ABNORMAL HIGH (ref 6.5–8.1)

## 2022-08-02 LAB — CBC WITH DIFFERENTIAL/PLATELET
Abs Immature Granulocytes: 0.05 10*3/uL (ref 0.00–0.07)
Basophils Absolute: 0.1 10*3/uL (ref 0.0–0.1)
Basophils Relative: 1 %
Eosinophils Absolute: 0 10*3/uL (ref 0.0–0.5)
Eosinophils Relative: 0 %
HCT: 39.5 % (ref 36.0–46.0)
Hemoglobin: 12.5 g/dL (ref 12.0–15.0)
Immature Granulocytes: 1 %
Lymphocytes Relative: 17 %
Lymphs Abs: 1.5 10*3/uL (ref 0.7–4.0)
MCH: 26.5 pg (ref 26.0–34.0)
MCHC: 31.6 g/dL (ref 30.0–36.0)
MCV: 83.9 fL (ref 80.0–100.0)
Monocytes Absolute: 0.3 10*3/uL (ref 0.1–1.0)
Monocytes Relative: 3 %
Neutro Abs: 6.9 10*3/uL (ref 1.7–7.7)
Neutrophils Relative %: 78 %
Platelets: 347 10*3/uL (ref 150–400)
RBC: 4.71 MIL/uL (ref 3.87–5.11)
RDW: 13.5 % (ref 11.5–15.5)
WBC: 8.7 10*3/uL (ref 4.0–10.5)
nRBC: 0 % (ref 0.0–0.2)

## 2022-08-02 LAB — URINALYSIS, ROUTINE W REFLEX MICROSCOPIC
Bilirubin Urine: NEGATIVE
Glucose, UA: NEGATIVE mg/dL
Ketones, ur: 80 mg/dL — AB
Leukocytes,Ua: NEGATIVE
Nitrite: NEGATIVE
Protein, ur: 30 mg/dL — AB
Specific Gravity, Urine: 1.015 (ref 1.005–1.030)
pH: 7 (ref 5.0–8.0)

## 2022-08-02 LAB — LIPASE, BLOOD: Lipase: 27 U/L (ref 11–51)

## 2022-08-02 LAB — URINALYSIS, MICROSCOPIC (REFLEX): RBC / HPF: >50 RBC/hpf (ref 0–5)

## 2022-08-02 MED ORDER — ONDANSETRON 4 MG PO TBDP
4.0000 mg | ORAL_TABLET | Freq: Three times a day (TID) | ORAL | 1 refills | Status: DC | PRN
  Filled 2022-08-02: qty 12, 4d supply, fill #0

## 2022-08-02 NOTE — ED Triage Notes (Signed)
Pt here via [facility] for LLQ abd pain/cramping along with N/V that started yesterday. Pt reports feeling constipated, but had a small BM this morning. Cbg 117, 140/80, 80HR, 18RR, 96% RA.

## 2022-08-02 NOTE — ED Provider Notes (Addendum)
[facility] Provider Note   CSN: [id number] Arrival date & time: 08/02/22  1226     History  Chief Complaint  Patient presents with   Abdominal Pain    Melody Hicks is a 62 y.o. female.  Patient with the acute onset of left upper quadrant abdominal pain this morning at about 6:30 in the morning.  Quite severe.  Associated with nausea vomiting.  Never had pain like this before.  Now it is pretty much resolved.  No prior history of kidney stones.  Past medical history significant for hypertension anxiety herniated disc.  Patient is a former smoker.  Patient not on blood thinners.       Home Medications Prior to Admission medications   Medication Sig Start Date End Date Taking? Authorizing Provider  ondansetron (ZOFRAN-ODT) 4 MG disintegrating tablet Take 1 tablet (4 mg total) by mouth every 8 (eight) hours as needed. 08/02/22  Yes Fredia Sorrow, MD  amLODipine (NORVASC) 5 MG tablet Take 1 tablet by mouth once daily for blood pressure 11/27/21   Pleas Koch, NP  ascorbic acid (VITAMIN C) 500 MG tablet Take 1,000 mg by mouth.    [provider]  aspirin 81 MG EC tablet Take by mouth.    [provider]  Cholecalciferol 100 MCG (4000 UT) CAPS Take 4,000 Int'l Units by mouth daily.    [provider]  DIGESTIVE ENZYMES PO Take by mouth. Patient not taking: Reported on 05/30/2022    [provider]  Omega-3 Fatty Acids (FISH OIL) 1000 MG CAPS Take by mouth.    [provider]  OVER THE COUNTER MEDICATION Sea moss    [provider]  TURMERIC CURCUMIN PO Take by mouth.    [provider]  vitamin B-12 (CYANOCOBALAMIN) 1000 MCG tablet Take 1,000 mcg by mouth daily.    [provider]      Allergies    Latex, Lisinopril, and Iodinated contrast media    Review of Systems   Review of Systems  Constitutional:  Negative for chills and fever.  HENT:  Negative  for rhinorrhea and sore throat.   Eyes:  Negative for visual disturbance.  Respiratory:  Negative for cough and shortness of breath.   Cardiovascular:  Negative for chest pain and leg swelling.  Gastrointestinal:  Positive for nausea and vomiting. Negative for abdominal pain and diarrhea.  Genitourinary:  Positive for flank pain. Negative for dysuria and hematuria.  Musculoskeletal:  Negative for back pain and neck pain.  Skin:  Negative for rash.  Neurological:  Negative for dizziness, light-headedness and headaches.  Hematological:  Does not bruise/bleed easily.  Psychiatric/Behavioral:  Negative for confusion.     Physical Exam Updated Vital Signs BP 138/87   Pulse 86   Temp 98.7 F (37.1 C)   Resp 18   Ht 1.626 m (5' 4"$ )   Wt 54.4 kg   SpO2 98%   BMI 20.60 kg/m  Physical Exam Vitals and nursing note reviewed.  Constitutional:      General: She is not in acute distress.    Appearance: Normal appearance. She is well-developed.  HENT:     Head: Normocephalic and atraumatic.  Eyes:     Conjunctiva/sclera: Conjunctivae normal.  Cardiovascular:     Rate and Rhythm: Normal rate and regular rhythm.     Heart sounds: No murmur heard. Pulmonary:     Effort: Pulmonary effort is normal. No respiratory distress.  Breath sounds: Normal breath sounds.  Abdominal:     General: There is no distension.     Palpations: Abdomen is soft.     Tenderness: There is no abdominal tenderness. There is no guarding.  Musculoskeletal:        General: No swelling.     Cervical back: Neck supple.  Skin:    General: Skin is warm and dry.     Capillary Refill: Capillary refill takes less than 2 seconds.  Neurological:     General: No focal deficit present.     Mental Status: She is alert and oriented to person, place, and time.  Psychiatric:        Mood and Affect: Mood normal.     ED Results / Procedures / Treatments   Labs (all labs ordered are listed, but only abnormal results are  displayed) Labs Reviewed  COMPREHENSIVE METABOLIC PANEL - Abnormal; Notable for the following components:      Result Value   Total Protein 8.4 (*)    All other components within normal limits  URINALYSIS, ROUTINE W REFLEX MICROSCOPIC - Abnormal; Notable for the following components:   APPearance CLOUDY (*)    Hgb urine dipstick LARGE (*)    Ketones, ur 80 (*)    Protein, ur 30 (*)    All other components within normal limits  URINALYSIS, MICROSCOPIC (REFLEX) - Abnormal; Notable for the following components:   Bacteria, UA RARE (*)    All other components within normal limits  LIPASE, BLOOD  CBC WITH DIFFERENTIAL/PLATELET    EKG None  Radiology CT Renal Stone Study  Result Date: 08/02/2022 CLINICAL DATA:  Abdominal flank pain stone suspected. Left upper quadrant pain. Hematuria. EXAM: CT ABDOMEN AND PELVIS WITHOUT CONTRAST TECHNIQUE: Multidetector CT imaging of the abdomen and pelvis was performed following the standard protocol without IV contrast. RADIATION DOSE REDUCTION: This exam was performed according to the departmental dose-optimization program which includes automated exposure control, adjustment of the mA and/or kV according to patient size and/or use of iterative reconstruction technique. COMPARISON:  None Available. FINDINGS: Lower chest: Mild dependent changes in the lung bases. Hepatobiliary: There are a few scattered subcentimeter low-attenuation lesions in the liver. These are too small to characterize but likely represent small cysts or hemangiomas. Gallbladder and bile ducts are unremarkable. Pancreas: Unremarkable. No pancreatic ductal dilatation or surrounding inflammatory changes. Spleen: Normal in size without focal abnormality. Adrenals/Urinary Tract: No adrenal gland nodules. Mildly enlarged left kidney with stranding around the left kidney and ureter. Mild left hydroureter and hydronephrosis. Stone in the left ureterovesical junction measuring 4 mm diameter. Right  kidney, right ureter, and bladder are normal. Stomach/Bowel: Stomach and small bowel are mostly decompressed. Scattered stool in the colon without abnormal distention. No wall thickening or inflammatory changes are appreciated. Appendix is normal. Vascular/Lymphatic: Scattered aortic calcification. No aneurysm. No significant lymphadenopathy. Reproductive: Lobular enlargement of the uterus with scattered calcifications consistent with uterine fibroids. No abnormal adnexal masses. Other: No free air or free fluid in the abdomen. Moderate periumbilical hernia containing fat. Musculoskeletal: No acute or significant osseous findings. Degenerative changes in the hips. IMPRESSION: 1. 4 mm stone in the distal left kidney with moderate proximal obstruction. 2. Uterine fibroids. 3. Periumbilical hernia containing fat. 4. Aortic atherosclerosis. 5. Scattered subcentimeter liver lesions are too small to characterize but likely represent cysts or hemangiomas. Electronically Signed   By: Lucienne Capers M.D.   On: 08/02/2022 17:09    Procedures Procedures    Medications  Ordered in ED Medications - No data to display  ED Course/ Medical Decision Making/ A&P                             Medical Decision Making Amount and/or Complexity of Data Reviewed Labs: ordered. Radiology: ordered.  Risk Prescription drug management.   Patient's urinalysis significant for large amount of blood in the urine.  No significant bacteria no white blood cells.  Patient's labs are still pending.  CBC no leukocytosis hemoglobin 12.5.  Lipase normal complete metabolic panel normal electrolytes normal kidney function normal GFR greater than 60.  Urinalysis had a lot of hematuria.  Clinically concerning for kidney stone CT scan shows a left distal 4 mm kidney stone.  With moderate proximal obstruction.  Renal function is normal.  Patient completely pain-free so the stone probably stop moving suspected will start to move.   Patient given referral to urology.  Patient also given recommendation to take a strength Tylenol and Motrin or Naprosyn.  And patient given a prescription for the ODT Zofran.  Patient will return for any new or worse symptoms     Final Clinical Impression(s) / ED Diagnoses Final diagnoses:  Left upper quadrant abdominal pain  Flank pain  Left ureteral stone    Rx / DC Orders ED Discharge Orders          Ordered    ondansetron (ZOFRAN-ODT) 4 MG disintegrating tablet  Every 8 hours PRN        08/02/22 1736              Fredia Sorrow, MD 08/02/22 1649    Fredia Sorrow, MD 08/02/22 1858

## 2022-08-02 NOTE — Discharge Instructions (Addendum)
CT scan shows evidence of a left 4 mm kidney stone.  It is almost in the bladder.  Make an appointment to follow-up with urology.  Return for any new or worse symptoms.  Recommend taking extra strength Tylenol and Motrin or Naprosyn as needed for the pain if it reoccurs.  Antinausea medicine provided.

## 2022-08-16 ENCOUNTER — Ambulatory Visit: Payer: Federal, State, Local not specified - PPO | Admitting: Primary Care

## 2022-08-16 ENCOUNTER — Encounter: Payer: Self-pay | Admitting: Primary Care

## 2022-08-16 VITALS — BP 148/84 | HR 75 | Temp 97.9°F | Ht 64.0 in | Wt 122.0 lb

## 2022-08-16 DIAGNOSIS — N200 Calculus of kidney: Secondary | ICD-10-CM

## 2022-08-16 HISTORY — DX: Calculus of kidney: N20.0

## 2022-08-16 NOTE — Patient Instructions (Signed)
Please update if your symptoms return. Glad you are doing better.  It was a pleasure to see you today!

## 2022-08-16 NOTE — Progress Notes (Signed)
Established Patient Office Visit  Subjective   Patient ID: Melody Hicks, female    DOB: Dec 17, 1960  Age: 62 y.o. MRN: [id number]  Chief Complaint  Patient presents with   Hospitalization Follow-up    HPI  Melody Hicks is a 62 year old female with past medical history of hypertension presents today for hospital follow up.   She was evaluated in the emergency room on 08/02/22 for left upper quadrant pain, hematuria and abdominal flank pain. Ct abd/pelvis was completed and showed 4 mm stone in the left kidney with moderate proximal obstruction, uterine fibroids, periumbilical hernia containing fat, aortic atherosclerosis and small scattered sub centimeter liver lesions. CBC with diff, CMP, Lipase were all within normal limits. UA was positive for blood, ketones, protein. She was referral to urology for the kidney stone and was asked to try extra strength tylenol, motrin or naprsoyn.   Since then, she is doing much better. Denies any pain, nausea, fever, chills or vomiting. Feels that she has passed the stone because she saw the blood in the toilet and two small fragments in the toilet. She denies any more hematuria.   She has not seen the urologist yet. She does report that she was taking a lot of vitamin C and D. She is currently on vitamin D 2000 units and 2000 mg of calcium daily.    Patient Active Problem List   Diagnosis Date Noted   Kidney stone on left side 08/16/2022   Salivary gland stone 03/17/2022   Vitamin D deficiency 11/11/2021   Chronic meniscal tear of knee 07/07/2021   Preventative health care 09/24/2019   Screening for breast cancer 09/24/2018   Right shoulder pain 09/24/2018   Hypertension 12/01/2015   Past Medical History:  Diagnosis Date   Anxiety    Herniated disc, cervical    Hypertension    Right facial swelling 08/06/2019   Past Surgical History:  Procedure Laterality Date   LYMPH NODE BIOPSY     Social History   Tobacco Use   Smoking  status: Former   Smokeless tobacco: Never  Scientific laboratory technician Use: Never used  Substance Use Topics   Alcohol use: Not Currently   Drug use: Never   Family Status  Relation Name Status   Mother  Deceased   Father  Deceased   Family History  Problem Relation Age of Onset   Diabetes Mother    Stroke Mother    Hypertension Mother    Heart disease Mother    Hypertension Father    Stroke Father    Allergies  Allergen Reactions   Latex Itching   Lisinopril Other (See Comments)    Other reaction(s): Hypotension Low bp,    Iodinated Contrast Media Nausea And Vomiting      Review of Systems  Constitutional:  Negative for chills and fever.  Respiratory:  Negative for shortness of breath.   Cardiovascular:  Negative for chest pain.  Gastrointestinal:  Negative for nausea and vomiting.  Genitourinary:  Negative for dysuria, flank pain, hematuria and urgency.      Objective:     BP (!) 148/84   Pulse 75   Temp 97.9 F (36.6 C) (Temporal)   Ht '5\' 4"'$  (1.626 m)   Wt 122 lb (55.3 kg)   SpO2 99%   BMI 20.94 kg/m  BP Readings from Last 3 Encounters:  08/16/22 (!) 148/84  08/02/22 138/87  05/30/22 (!) 148/84   Wt Readings from Last 3  Encounters:  08/16/22 122 lb (55.3 kg)  08/02/22 120 lb (54.4 kg)  05/30/22 130 lb (59 kg)      Physical Exam Vitals and nursing note reviewed.  Constitutional:      Appearance: Normal appearance.  Cardiovascular:     Rate and Rhythm: Normal rate and regular rhythm.     Pulses: Normal pulses.     Heart sounds: Normal heart sounds.  Pulmonary:     Effort: Pulmonary effort is normal.     Breath sounds: Normal breath sounds.  Abdominal:     General: Bowel sounds are normal.     Palpations: Abdomen is soft.  Neurological:     Mental Status: She is alert and oriented to person, place, and time.  Psychiatric:        Mood and Affect: Mood normal.        Behavior: Behavior normal.      No results found for any visits on  08/16/22.     The 10-year ASCVD risk score (Arnett DK, et al., 2019) is: 19%    Assessment & Plan:   Problem List Items Addressed This Visit       Genitourinary   Kidney stone on left side - Primary    ER notes, CT scan and Labs reviewed.   Suspect patient has passed the stone.   Recommend decreasing calcium intake to 1000 mg daily.   Continue Vitamin D 2000 units daily and increased water intake.  She will update if symptoms return.        No follow-ups on file.    Tinnie Gens, BSN-RN, DNP STUDENT

## 2022-08-16 NOTE — Progress Notes (Signed)
Subjective:    Patient ID: Melody Hicks, female    DOB: [birth year], 62 y.o.   MRN: [id number]  HPI  Melody Hicks is a very pleasant 62 y.o. female with a history of salivary stone, chronic meniscal tear of knee, hypertension who presents today for ED follow up.  She presented to [facility] ED on 08/02/22 for LUQ abdominal pain that began earlier that morning with nausea and vomiting. Lab testing revealed microscopic hematuria, no leukocytosis, and normal renal function. She underwent CT renal stone study which revealed left distal 4 mm kidney stone with moderate proximal obstruction. Her pain resolved during her ED stay so she was discharged home with a prescription for Zofran. She was also referred to Urology.  Since her ED visit her pain returned which was more so located to the left mid abdomen and left lower abdomen. She massaged her pain each time it occurred and was able to pass two fragments with mild bleeding. She has been pain free since. She has no concerns today. She questions if she needs to see Urology. She denies a prior history of renal stones. She does take 2000 mg of calcium daily along with 5000 IU of vitamin D. She drinks plenty of water typically.    Review of Systems  Gastrointestinal:  Negative for abdominal pain.  Genitourinary:  Negative for dysuria, flank pain, frequency and hematuria.         Past Medical History:  Diagnosis Date   Anxiety    Herniated disc, cervical    Hypertension    Right facial swelling 08/06/2019    Social History   Socioeconomic History   Marital status: Single    Spouse name: Not on file   Number of children: Not on file   Years of education: Not on file   Highest education level: Not on file  Occupational History   Not on file  Tobacco Use   Smoking status: Former   Smokeless tobacco: Never  Vaping Use   Vaping Use: Never used  Substance and Sexual Activity   Alcohol use: Not Currently   Drug use:  Never   Sexual activity: Not Currently  Other Topics Concern   Not on file  Social History Narrative   Single.   1 son. 1 grandchild.   Working part time.   Social Determinants of Health   Financial Resource Strain: Not on file  Food Insecurity: Not on file  Transportation Needs: Not on file  Physical Activity: Not on file  Stress: Not on file  Social Connections: Not on file  Intimate Partner Violence: Not on file    Past Surgical History:  Procedure Laterality Date   LYMPH NODE BIOPSY      Family History  Problem Relation Age of Onset   Diabetes Mother    Stroke Mother    Hypertension Mother    Heart disease Mother    Hypertension Father    Stroke Father     Allergies  Allergen Reactions   Latex Itching   Lisinopril Other (See Comments)    Other reaction(s): Hypotension Low bp,    Iodinated Contrast Media Nausea And Vomiting    Current Outpatient Medications on File Prior to Visit  Medication Sig Dispense Refill   amLODipine (NORVASC) 5 MG tablet Take 1 tablet by mouth once daily for blood pressure 90 tablet 3   aspirin 81 MG EC tablet Take by mouth.     Cholecalciferol 100 MCG (4000 UT)  CAPS Take 4,000 Int'l Units by mouth daily.     Omega-3 Fatty Acids (FISH OIL) 1000 MG CAPS Take by mouth.     ondansetron (ZOFRAN-ODT) 4 MG disintegrating tablet Take 1 tablet (4 mg total) by mouth every 8 (eight) hours as needed. 12 tablet 1   OVER THE COUNTER MEDICATION Sea moss     TURMERIC CURCUMIN PO Take by mouth.     vitamin B-12 (CYANOCOBALAMIN) 1000 MCG tablet Take 1,000 mcg by mouth daily.     ascorbic acid (VITAMIN C) 500 MG tablet Take 1,000 mg by mouth. (Patient not taking: Reported on 08/16/2022)     DIGESTIVE ENZYMES PO Take by mouth. (Patient not taking: Reported on 05/30/2022)     No current facility-administered medications on file prior to visit.    BP (!) 148/84   Pulse 75   Temp 97.9 F (36.6 C) (Temporal)   Ht '5\' 4"'$  (1.626 m)   Wt 122 lb (55.3  kg)   SpO2 99%   BMI 20.94 kg/m  Objective:   Physical Exam Cardiovascular:     Rate and Rhythm: Normal rate and regular rhythm.  Pulmonary:     Effort: Pulmonary effort is normal.     Breath sounds: Normal breath sounds.  Abdominal:     Palpations: Abdomen is soft.     Tenderness: There is no abdominal tenderness. There is no right CVA tenderness or left CVA tenderness.  Musculoskeletal:     Cervical back: Neck supple.  Skin:    General: Skin is warm and dry.           Assessment & Plan:  Kidney stone on left side Assessment & Plan: ER notes, CT scan and Labs reviewed.   Based on HPI and exam today, it seems that she has passed the stone.   Recommend decreasing calcium intake to 1000 mg daily.   Continue Vitamin D 2000 units daily and maintain good water intake. Okay to forego Urology visit for now.  She will update with any changes.  I evaluated patient, was consulted regarding treatment, and agree with assessment and plan per Tinnie Gens, RN, DNP student.   Allie Bossier, NP-C          Pleas Koch, NP

## 2022-08-16 NOTE — Assessment & Plan Note (Addendum)
ER notes, CT scan and Labs reviewed.   Based on HPI and exam today, it seems that she has passed the stone.   Recommend decreasing calcium intake to 1000 mg daily.   Continue Vitamin D 2000 units daily and maintain good water intake. Okay to forego Urology visit for now.  She will update with any changes.  I evaluated patient, was consulted regarding treatment, and agree with assessment and plan per Tinnie Gens, RN, DNP student.   Allie Bossier, NP-C

## 2022-10-07 ENCOUNTER — Ambulatory Visit
Admission: RE | Admit: 2022-10-07 | Discharge: 2022-10-07 | Disposition: A | Discharge status: Home / Self Care | Payer: Federal, State, Local not specified - PPO | Source: Ambulatory Visit | Attending: Emergency Medicine | Admitting: Emergency Medicine

## 2022-10-07 VITALS — BP 132/80 | HR 88 | Temp 98.6°F | Resp 18

## 2022-10-07 DIAGNOSIS — H00011 Hordeolum externum right upper eyelid: Secondary | ICD-10-CM | POA: Diagnosis not present

## 2022-10-07 MED ORDER — ERYTHROMYCIN 5 MG/GM OP OINT: TOPICAL_OINTMENT | OPHTHALMIC | 0 refills | Status: DC

## 2022-10-07 NOTE — ED Provider Notes (Signed)
[name]    CSN: [id number] Arrival date & time: 10/07/22  1711      History   Chief Complaint Chief Complaint  Patient presents with   Eye Problem    Right eye lid swollen. Happened Tuesday night while sleeping. Woke up Wednesday morning irritated. As the day progressed it became swollen. - Entered by patient    HPI Melody Hicks is a 62 y.o. female.  Patient presents with redness and swelling of her right upper eyelid x 3 days.  The area has a small pustule now.  She reports possible drainage from the area yesterday.  She denies eye injury, change in vision, fever, or other symptoms.  Treatment with Tylenol.  Her medical history includes hypertension, kidney stone, cervical herniated disc, anxiety.  The history is provided by the patient and medical records.    Past Medical History:  Diagnosis Date   Anxiety    Herniated disc, cervical    Hypertension    Right facial swelling 08/06/2019    Patient Active Problem List   Diagnosis Date Noted   Kidney stone on left side 08/16/2022   Salivary gland stone 03/17/2022   Vitamin D deficiency 11/11/2021   Chronic meniscal tear of knee 07/07/2021   Preventative health care 09/24/2019   Screening for breast cancer 09/24/2018   Right shoulder pain 09/24/2018   Hypertension 12/01/2015    Past Surgical History:  Procedure Laterality Date   LYMPH NODE BIOPSY      OB History     Gravida  2   Para  1   Term  1   Preterm      AB  1   Living  1      SAB  1   IAB      Ectopic      Multiple      Live Births  1            Home Medications    Prior to Admission medications   Medication Sig Start Date End Date Taking? Authorizing Provider  erythromycin ophthalmic ointment Place a 1/2 inch ribbon of ointment into the lower eyelid four times a day for 7 days. 10/07/22  Yes Mickie Bail, NP  amLODipine (NORVASC) 5 MG tablet Take 1 tablet by mouth once daily for blood pressure 11/27/21    Doreene Nest, NP  ascorbic acid (VITAMIN C) 500 MG tablet Take 1,000 mg by mouth. Patient not taking: Reported on 08/16/2022    [provider]  aspirin 81 MG EC tablet Take by mouth.    [provider]  Cholecalciferol 100 MCG (4000 UT) CAPS Take 4,000 Int'l Units by mouth daily.    [provider]  DIGESTIVE ENZYMES PO Take by mouth. Patient not taking: Reported on 05/30/2022    [provider]  Omega-3 Fatty Acids (FISH OIL) 1000 MG CAPS Take by mouth.    [provider]  ondansetron (ZOFRAN-ODT) 4 MG disintegrating tablet Take 1 tablet (4 mg total) by mouth every 8 (eight) hours as needed. 08/02/22   Vanetta Mulders, MD  OVER THE COUNTER MEDICATION Sea moss    [provider]  TURMERIC CURCUMIN PO Take by mouth.    [provider]  vitamin B-12 (CYANOCOBALAMIN) 1000 MCG tablet Take 1,000 mcg by mouth daily.    [provider]    Family History Family History  Problem Relation Age of Onset   Diabetes Mother    Stroke Mother  Hypertension Mother    Heart disease Mother    Hypertension Father    Stroke Father     Social History Social History   Tobacco Use   Smoking status: Former   Smokeless tobacco: Never  Building services engineer Use: Never used  Substance Use Topics   Alcohol use: Not Currently   Drug use: Never     Allergies   Latex, Lisinopril, and Iodinated contrast media   Review of Systems Review of Systems  Constitutional:  Negative for chills and fever.  HENT:  Negative for ear pain and sore throat.   Eyes:  Positive for pain and redness. Negative for discharge, itching and visual disturbance.  Respiratory:  Negative for cough and shortness of breath.   All other systems reviewed and are negative.    Physical Exam Triage Vital Signs ED Triage Vitals [10/07/22 1720]  Enc Vitals Group     BP      Pulse Rate 88     Resp 18     Temp 98.6 F (37 C)     Temp src      SpO2 96  %     Weight      Height      Head Circumference      Peak Flow      Pain Score      Pain Loc      Pain Edu?      Excl. in GC?    No data found.  Updated Vital Signs BP 132/80   Pulse 88   Temp 98.6 F (37 C)   Resp 18   SpO2 96%   Visual Acuity Right Eye Distance:   Left Eye Distance:   Bilateral Distance:    Right Eye Near:   Left Eye Near:    Bilateral Near:     Physical Exam Vitals and nursing note reviewed.  Constitutional:      General: She is not in acute distress.    Appearance: Normal appearance. She is well-developed. She is not ill-appearing.  HENT:     Right Ear: Tympanic membrane normal.     Left Ear: Tympanic membrane normal.     Nose: Nose normal.     Mouth/Throat:     Mouth: Mucous membranes are moist.     Pharynx: Oropharynx is clear.  Eyes:     General: Vision grossly intact.     Extraocular Movements: Extraocular movements intact.     Conjunctiva/sclera: Conjunctivae normal.     Pupils: Pupils are equal, round, and reactive to light.      Comments: Tender stye on right upper eyelid with small pustule on lash line. No eye drainage.   Cardiovascular:     Rate and Rhythm: Normal rate and regular rhythm.     Heart sounds: Normal heart sounds.  Pulmonary:     Effort: Pulmonary effort is normal. No respiratory distress.     Breath sounds: Normal breath sounds.  Musculoskeletal:     Cervical back: Neck supple.  Skin:    General: Skin is warm and dry.  Neurological:     Mental Status: She is alert.  Psychiatric:        Mood and Affect: Mood normal.        Behavior: Behavior normal.      UC Treatments / Results  Labs (all labs ordered are listed, but only abnormal results are displayed) Labs Reviewed - No data to display  EKG   Radiology No  results found.  Procedures Procedures (including critical care time)  Medications Ordered in UC Medications - No data to display  Initial Impression / Assessment and Plan / UC Course  I  have reviewed the triage vital signs and the nursing notes.  Pertinent labs & imaging results that were available during my care of the patient were reviewed by me and considered in my medical decision making (see chart for details).    Hordeolum of right upper eyelid.  Treating with erythromycin eye ointment and warm compresses.  Education provided on stye.  Instructed patient to follow-up with her PCP or eye care provider if her symptoms are not improving.  ED precautions given.  Patient agrees to plan of care.   Final Clinical Impressions(s) / UC Diagnoses   Final diagnoses:  Hordeolum externum of right upper eyelid     Discharge Instructions      Use the antibiotic eye ointment as prescribed.    Follow-up with your primary care provider or eye care provider if your symptoms are not improving.    Go to the emergency department if you have acute eye pain, changes in your vision, or other concerning symptoms.        ED Prescriptions     Medication Sig Dispense Auth. Provider   erythromycin ophthalmic ointment Place a 1/2 inch ribbon of ointment into the lower eyelid four times a day for 7 days. 3.5 g Mickie Bail, NP      PDMP not reviewed this encounter.   Mickie Bail, NP 10/07/22 [phone]

## 2022-10-07 NOTE — ED Triage Notes (Signed)
Patient to Urgent Care with complaints of right sided eyelid swelling and irritation that started on Tuesday night. Reports waking up on Wednesday morning with more discomfort. Possible drainage yesterday.

## 2022-10-07 NOTE — Discharge Instructions (Addendum)
Use the antibiotic eye ointment as prescribed.    Follow-up with your primary care provider or eye care provider if your symptoms are not improving.    Go to the emergency department if you have acute eye pain, changes in your vision, or other concerning symptoms.

## 2022-10-17 DIAGNOSIS — H0011 Chalazion right upper eyelid: Secondary | ICD-10-CM | POA: Diagnosis not present

## 2022-11-15 ENCOUNTER — Other Ambulatory Visit: Payer: Self-pay | Admitting: Primary Care

## 2022-11-15 DIAGNOSIS — I10 Essential (primary) hypertension: Secondary | ICD-10-CM

## 2022-11-17 ENCOUNTER — Ambulatory Visit (INDEPENDENT_AMBULATORY_CARE_PROVIDER_SITE_OTHER): Payer: Federal, State, Local not specified - PPO | Admitting: Primary Care

## 2022-11-17 ENCOUNTER — Encounter: Payer: Self-pay | Admitting: Primary Care

## 2022-11-17 VITALS — BP 150/80 | HR 80 | Temp 97.7°F | Ht 64.0 in | Wt 117.0 lb

## 2022-11-17 DIAGNOSIS — Z0000 Encounter for general adult medical examination without abnormal findings: Secondary | ICD-10-CM | POA: Diagnosis not present

## 2022-11-17 DIAGNOSIS — Z1231 Encounter for screening mammogram for malignant neoplasm of breast: Secondary | ICD-10-CM

## 2022-11-17 DIAGNOSIS — M23206 Derangement of unspecified meniscus due to old tear or injury, right knee: Secondary | ICD-10-CM | POA: Diagnosis not present

## 2022-11-17 DIAGNOSIS — E559 Vitamin D deficiency, unspecified: Secondary | ICD-10-CM

## 2022-11-17 DIAGNOSIS — I10 Essential (primary) hypertension: Secondary | ICD-10-CM | POA: Diagnosis not present

## 2022-11-17 LAB — HEMOGLOBIN A1C: Hgb A1c MFr Bld: 5.5 % (ref 4.6–6.5)

## 2022-11-17 LAB — VITAMIN D 25 HYDROXY (VIT D DEFICIENCY, FRACTURES): VITD: 75.06 ng/mL (ref 30.00–100.00)

## 2022-11-17 LAB — LIPID PANEL
Cholesterol: 163 mg/dL (ref 0–200)
HDL: 51.1 mg/dL (ref >39.00)
LDL Cholesterol: 97 mg/dL (ref 0–99)
NonHDL: 111.65
Total CHOL/HDL Ratio: 3
Triglycerides: 72 mg/dL (ref 0.0–149.0)
VLDL: 14.4 mg/dL (ref 0.0–40.0)

## 2022-11-17 NOTE — Patient Instructions (Addendum)
Stop by the lab prior to leaving today. I will notify you of your results once received.   [facility] to [facility] your [facility].   Start monitoring your blood pressure daily, around the same time of day, for the next 2-3 weeks.  Ensure that you have rested for 30 minutes prior to checking your blood pressure.   Record your readings and notify me if you see numbers consistently at or above 140 on top and/or 90 on bottom.   It was a pleasure to see you today!

## 2022-11-17 NOTE — Assessment & Plan Note (Signed)
Continue vitamin D supplement. Repeat vitamin D level pending.

## 2022-11-17 NOTE — Assessment & Plan Note (Signed)
Stable, but has not been doing PT exercises.   Discussed to work on PT exercises. Continue to monitor.

## 2022-11-17 NOTE — Assessment & Plan Note (Signed)
Above goal today, also on recheck. She is stressed today.  Continue amlodipine 5 mg daily. She will monitor and notify if she sees readings consistently at or above 140/90.

## 2022-11-17 NOTE — Progress Notes (Signed)
Subjective:    Patient ID: Melody Hicks, female    DOB: [birth year], 62 y.o.   MRN: [id number]  HPI  Melody Hicks is a very pleasant 62 y.o. female who presents today for complete physical and follow up of chronic conditions.  Immunizations: -Tetanus: Completed in 2015 -Shingles: Never completed, declines  Diet: Fair diet.  Exercise: Active at home.  Eye exam: Completes annually  Dental exam: Completes semi-annually    Pap Smear: December 2021, follows with GYN Mammogram: March 2023  Colonoscopy: Completed in 2015, due 2025   BP Readings from Last 3 Encounters:  11/17/22 (!) 150/80  10/07/22 132/80  08/16/22 (!) 148/84    Wt Readings from Last 3 Encounters:  11/17/22 117 lb (53.1 kg)  08/16/22 122 lb (55.3 kg)  08/02/22 120 lb (54.4 kg)     Review of Systems  Constitutional:  Negative for unexpected weight change.  HENT:  Negative for rhinorrhea.   Respiratory:  Negative for cough and shortness of breath.   Cardiovascular:  Negative for chest pain.  Gastrointestinal:  Negative for constipation and diarrhea.  Genitourinary:  Negative for difficulty urinating.  Musculoskeletal:  Positive for myalgias.  Skin:  Negative for rash.  Allergic/Immunologic: Negative for environmental allergies.  Neurological:  Negative for dizziness and headaches.  Psychiatric/Behavioral:  The patient is not nervous/anxious.          Past Medical History:  Diagnosis Date   Anxiety    Herniated disc, cervical    Hypertension    Kidney stone on left side 08/16/2022   Right facial swelling 08/06/2019    Social History   Socioeconomic History   Marital status: Single    Spouse name: Not on file   Number of children: Not on file   Years of education: Not on file   Highest education level: Not on file  Occupational History   Not on file  Tobacco Use   Smoking status: Former   Smokeless tobacco: Never  Vaping Use   Vaping Use: Never used  Substance and Sexual  Activity   Alcohol use: Not Currently   Drug use: Never   Sexual activity: Not Currently  Other Topics Concern   Not on file  Social History Narrative   Single.   1 son. 1 grandchild.   Working part time.   Social Determinants of Health   Financial Resource Strain: Not on file  Food Insecurity: Not on file  Transportation Needs: Not on file  Physical Activity: Not on file  Stress: Not on file  Social Connections: Not on file  Intimate Partner Violence: Not on file    Past Surgical History:  Procedure Laterality Date   LYMPH NODE BIOPSY      Family History  Problem Relation Age of Onset   Diabetes Mother    Stroke Mother    Hypertension Mother    Heart disease Mother    Hypertension Father    Stroke Father     Allergies  Allergen Reactions   Latex Itching   Lisinopril Other (See Comments)    Other reaction(s): Hypotension Low bp,    Iodinated Contrast Media Nausea And Vomiting    Current Outpatient Medications on File Prior to Visit  Medication Sig Dispense Refill   amLODipine (NORVASC) 5 MG tablet Take 1 tablet by mouth once daily for blood pressure 90 tablet 0   ascorbic acid (VITAMIN C) 500 MG tablet Take 500 mg by mouth.     aspirin 81 MG  EC tablet Take by mouth.     Cholecalciferol 100 MCG (4000 UT) CAPS Take 2,000 Int'l Units by mouth daily.     Omega-3 Fatty Acids (FISH OIL) 1000 MG CAPS Take by mouth.     OVER THE COUNTER MEDICATION Sea moss     TURMERIC CURCUMIN PO Take by mouth.     vitamin B-12 (CYANOCOBALAMIN) 1000 MCG tablet Take 1,000 mcg by mouth daily.     No current facility-administered medications on file prior to visit.    BP (!) 150/80   Pulse 80   Temp 97.7 F (36.5 C) (Temporal)   Ht 5\' 4"  (1.626 m)   Wt 117 lb (53.1 kg)   SpO2 99%   BMI 20.08 kg/m  Objective:   Physical Exam HENT:     Right Ear: Tympanic membrane and ear canal normal.     Left Ear: Tympanic membrane and ear canal normal.     Nose: Nose normal.  Eyes:      Conjunctiva/sclera: Conjunctivae normal.     Pupils: Pupils are equal, round, and reactive to light.  Neck:     Thyroid: No thyromegaly.  Cardiovascular:     Rate and Rhythm: Normal rate and regular rhythm.     Heart sounds: No murmur heard. Pulmonary:     Effort: Pulmonary effort is normal.     Breath sounds: Normal breath sounds. No rales.  Abdominal:     General: Bowel sounds are normal.     Palpations: Abdomen is soft.     Tenderness: There is no abdominal tenderness.  Musculoskeletal:        General: Normal range of motion.     Cervical back: Neck supple.  Lymphadenopathy:     Cervical: No cervical adenopathy.  Skin:    General: Skin is warm and dry.     Findings: No rash.  Neurological:     Mental Status: She is alert and oriented to person, place, and time.     Cranial Nerves: No cranial nerve deficit.     Deep Tendon Reflexes: Reflexes are normal and symmetric.  Psychiatric:        Mood and Affect: Mood normal.           Assessment & Plan:  Preventative health care Assessment & Plan: Declines Shingrix vaccines.  Pap smear UTD, follows with GYN Mammogram due, orders placed. Colonoscopy UTD, due 2025  Discussed the importance of a healthy diet and regular exercise in order for weight loss, and to reduce the risk of further co-morbidity.  Exam stable. Labs pending.  Follow up in 1 year for repeat physical.    Old tear of meniscus of right knee, unspecified meniscus, unspecified tear type Assessment & Plan: Stable, but has not been doing PT exercises.   Discussed to work on PT exercises. Continue to monitor.    Screening mammogram for breast cancer -     3D Screening Mammogram, Left and Right; Future  Vitamin D deficiency Assessment & Plan: Continue vitamin D supplement. Repeat vitamin D level pending.  Orders: -     VITAMIN D 25 Hydroxy (Vit-D Deficiency, Fractures)  Primary hypertension Assessment & Plan: Above goal today, also on  recheck. She is stressed today.  Continue amlodipine 5 mg daily. She will monitor and notify if she sees readings consistently at or above 140/90.    Orders: -     Lipid panel -     Hemoglobin A1c        Melody Hicks  Allayne Gitelman, NP

## 2022-11-17 NOTE — Assessment & Plan Note (Signed)
Declines Shingrix vaccines.  Pap smear UTD, follows with GYN Mammogram due, orders placed. Colonoscopy UTD, due 2025  Discussed the importance of a healthy diet and regular exercise in order for weight loss, and to reduce the risk of further co-morbidity.  Exam stable. Labs pending.  Follow up in 1 year for repeat physical.

## 2022-12-15 ENCOUNTER — Ambulatory Visit
Admission: RE | Admit: 2022-12-15 | Discharge: 2022-12-15 | Disposition: A | Discharge status: Home / Self Care | Payer: Federal, State, Local not specified - PPO | Source: Ambulatory Visit | Attending: Primary Care | Admitting: Primary Care

## 2022-12-15 DIAGNOSIS — Z1231 Encounter for screening mammogram for malignant neoplasm of breast: Secondary | ICD-10-CM | POA: Insufficient documentation

## 2023-02-12 ENCOUNTER — Other Ambulatory Visit: Payer: Self-pay | Admitting: Primary Care

## 2023-02-12 DIAGNOSIS — I10 Essential (primary) hypertension: Secondary | ICD-10-CM

## 2023-02-21 DIAGNOSIS — L719 Rosacea, unspecified: Secondary | ICD-10-CM | POA: Diagnosis not present

## 2023-02-21 DIAGNOSIS — L810 Postinflammatory hyperpigmentation: Secondary | ICD-10-CM | POA: Diagnosis not present

## 2023-02-27 ENCOUNTER — Encounter: Payer: Self-pay | Admitting: Emergency Medicine

## 2023-02-27 ENCOUNTER — Other Ambulatory Visit: Payer: Self-pay

## 2023-02-27 ENCOUNTER — Ambulatory Visit
Admission: EM | Admit: 2023-02-27 | Discharge: 2023-02-27 | Disposition: A | Discharge status: Home / Self Care | Payer: Federal, State, Local not specified - PPO | Attending: Emergency Medicine | Admitting: Emergency Medicine

## 2023-02-27 DIAGNOSIS — R21 Rash and other nonspecific skin eruption: Secondary | ICD-10-CM

## 2023-02-27 MED ORDER — DEXAMETHASONE SODIUM PHOSPHATE 10 MG/ML IJ SOLN
10.0000 mg | Freq: Once | INTRAMUSCULAR | Status: AC
  Administered 2023-02-27: 10 mg via INTRAMUSCULAR

## 2023-02-27 MED ORDER — PREDNISONE 10 MG (21) PO TBPK: ORAL_TABLET | Freq: Every day | ORAL | 0 refills | Status: DC

## 2023-02-27 NOTE — Discharge Instructions (Addendum)
Today you are being treated for a rash which is most consistent with a contact dermatitis, at this time no signs of infection  You have been given an injection of steroids today in the office today to help reduce the inflammatory process that occurs with this rash which will help minimize your itching as well as begin to clear  Starting tomorrow take prednisone every morning with food as directed, to continue the above process  You may continue use of topical calamine or Benadryl cream to help manage itching, you may also continue oral Benadryl  Please avoid long exposures to heat such as a hot steamy shower or being outside as this may cause further irritation to your rash  You may follow-up with his urgent care as needed if symptoms persist or worsen

## 2023-02-27 NOTE — ED Triage Notes (Signed)
Rash .  Seen by provider only

## 2023-02-28 NOTE — ED Provider Notes (Signed)
Melody Hicks    CSN: [id number] Arrival date & time: 02/27/23  1843      History   Chief Complaint Chief Complaint  Patient presents with   Rash    HPI Melody Hicks is a 62 y.o. female.   Patient presents for evaluation of erythematous pruritic rash present to the bilateral upper extremities beginning 2 days ago, has 1 lesion to the left thigh.  Has attempted use of a cream, hydrocortisone 10 and Benadryl which has been helpful with managing itching but rash has continued to persist.  Endorses that prior to rash beginning she was completing yard work, came in contact with insecticide and was possibly bitten by mosquitoes.  Denies presence of drainage or fever.  Denies respiratory symptoms.  Past Medical History:  Diagnosis Date   Anxiety    Herniated disc, cervical    Hypertension    Kidney stone on left side 08/16/2022   Right facial swelling 08/06/2019    Patient Active Problem List   Diagnosis Date Noted   Salivary gland stone 03/17/2022   Vitamin D deficiency 11/11/2021   Chronic meniscal tear of knee 07/07/2021   Preventative health care 09/24/2019   Screening for breast cancer 09/24/2018   Right shoulder pain 09/24/2018   Hypertension 12/01/2015    Past Surgical History:  Procedure Laterality Date   LYMPH NODE BIOPSY      OB History     Gravida  2   Para  1   Term  1   Preterm      AB  1   Living  1      SAB  1   IAB      Ectopic      Multiple      Live Births  1            Home Medications    Prior to Admission medications   Medication Sig Start Date End Date Taking? Authorizing Provider  predniSONE (STERAPRED UNI-PAK 21 TAB) 10 MG (21) TBPK tablet Take by mouth daily. Take 6 tabs by mouth daily  for 1 days, then 5 tabs for 1 days, then 4 tabs for 1 days, then 3 tabs for 1 days, 2 tabs for 1 days, then 1 tab by mouth daily for 1 days 02/27/23  Yes Salli Quarry R, NP  amLODipine (NORVASC) 5 MG tablet Take 1 tablet  by mouth once daily for blood pressure 02/12/23   Doreene Nest, NP  ascorbic acid (VITAMIN C) 500 MG tablet Take 500 mg by mouth.    [provider]  aspirin 81 MG EC tablet Take by mouth.    [provider]  Cholecalciferol 100 MCG (4000 UT) CAPS Take 2,000 Int'l Units by mouth daily.    [provider]  Omega-3 Fatty Acids (FISH OIL) 1000 MG CAPS Take by mouth.    [provider]  OVER THE COUNTER MEDICATION Sea moss    [provider]  TURMERIC CURCUMIN PO Take by mouth.    [provider]  vitamin B-12 (CYANOCOBALAMIN) 1000 MCG tablet Take 1,000 mcg by mouth daily.    [provider]    Family History Family History  Problem Relation Age of Onset   Diabetes Mother    Stroke Mother    Hypertension Mother    Heart disease Mother    Hypertension Father    Stroke Father     Social History Social History   Tobacco Use  Smoking status: Former   Smokeless tobacco: Never  Vaping Use   Vaping status: Never Used  Substance Use Topics   Alcohol use: Not Currently   Drug use: Never     Allergies   Latex, Lisinopril, and Iodinated contrast media   Review of Systems Review of Systems  Skin:  Positive for rash.     Physical Exam Triage Vital Signs ED Triage Vitals  Encounter Vitals Group     BP 02/27/23 1954 (!) 143/83     Systolic BP Percentile --      Diastolic BP Percentile --      Pulse Rate 02/27/23 1954 80     Resp --      Temp 02/27/23 1954 98.7 F (37.1 C)     Temp Source 02/27/23 1954 Oral     SpO2 02/27/23 1954 99 %     Weight --      Height --      Head Circumference --      Peak Flow --      Pain Score 02/27/23 2009 0     Pain Loc --      Pain Education --      Exclude from Growth Chart --    No data found.  Updated Vital Signs BP (!) 143/83 (BP Location: Left Arm)   Pulse 80   Temp 98.7 F (37.1 C) (Oral)   SpO2 99%   Visual Acuity Right Eye Distance:   Left Eye  Distance:   Bilateral Distance:    Right Eye Near:   Left Eye Near:    Bilateral Near:     Physical Exam Constitutional:      Appearance: Normal appearance.  Eyes:     Extraocular Movements: Extraocular movements intact.  Pulmonary:     Effort: Pulmonary effort is normal.  Skin:    Comments: Erythematous and flesh tone papular rash present to the bilateral upper extremities  Neurological:     Mental Status: She is alert and oriented to person, place, and time. Mental status is at baseline.      UC Treatments / Results  Labs (all labs ordered are listed, but only abnormal results are displayed) Labs Reviewed - No data to display  EKG   Radiology No results found.  Procedures Procedures (including critical care time)  Medications Ordered in UC Medications  dexamethasone (DECADRON) injection 10 mg (10 mg Intramuscular Given 02/27/23 2012)    Initial Impression / Assessment and Plan / UC Course  I have reviewed the triage vital signs and the nursing notes.  Pertinent labs & imaging results that were available during my care of the patient were reviewed by me and considered in my medical decision making (see chart for details).  Rash  Appears to be most consistent with a contact dermatitis, no signs of infection at this time, discussed this with patient, Decadron  IM given prior to discharge, prescribed prednisone taper for outpatient use, may continue use of antihistamines and topical products for management of pruritus, advised against long exposure to heat to prevent further irritation, given strict precautions for persisting or worsening symptoms to follow-up for reevaluation, Final Clinical Impressions(s) / UC Diagnoses   Final diagnoses:  Rash and nonspecific skin eruption     Discharge Instructions      Today you are being treated for a rash which is most consistent with a contact dermatitis, at this time no signs of infection  You have been given an  injection of  steroids today in the office today to help reduce the inflammatory process that occurs with this rash which will help minimize your itching as well as begin to clear  Starting tomorrow take prednisone every morning with food as directed, to continue the above process  You may continue use of topical calamine or Benadryl cream to help manage itching, you may also continue oral Benadryl  Please avoid long exposures to heat such as a hot steamy shower or being outside as this may cause further irritation to your rash  You may follow-up with his urgent care as needed if symptoms persist or worsen    ED Prescriptions     Medication Sig Dispense Auth. Provider   predniSONE (STERAPRED UNI-PAK 21 TAB) 10 MG (21) TBPK tablet Take by mouth daily. Take 6 tabs by mouth daily  for 1 days, then 5 tabs for 1 days, then 4 tabs for 1 days, then 3 tabs for 1 days, 2 tabs for 1 days, then 1 tab by mouth daily for 1 days 21 tablet Shilpa Bushee, Elita Boone, NP      PDMP not reviewed this encounter.   Valinda Hoar, [place] 02/28/23 [phone]

## 2023-04-05 DIAGNOSIS — H2513 Age-related nuclear cataract, bilateral: Secondary | ICD-10-CM | POA: Diagnosis not present

## 2023-04-05 DIAGNOSIS — H40023 Open angle with borderline findings, high risk, bilateral: Secondary | ICD-10-CM | POA: Diagnosis not present

## 2023-04-05 DIAGNOSIS — H40053 Ocular hypertension, bilateral: Secondary | ICD-10-CM | POA: Diagnosis not present

## 2023-05-20 ENCOUNTER — Emergency Department: Payer: Federal, State, Local not specified - PPO

## 2023-05-20 ENCOUNTER — Other Ambulatory Visit: Payer: Self-pay

## 2023-05-20 ENCOUNTER — Emergency Department
Admission: EM | Admit: 2023-05-20 | Discharge: 2023-05-21 | Disposition: A | Discharge status: Home / Self Care | Payer: Federal, State, Local not specified - PPO | Attending: Emergency Medicine | Admitting: Emergency Medicine

## 2023-05-20 DIAGNOSIS — I10 Essential (primary) hypertension: Secondary | ICD-10-CM | POA: Diagnosis not present

## 2023-05-20 DIAGNOSIS — R55 Syncope and collapse: Secondary | ICD-10-CM | POA: Diagnosis not present

## 2023-05-20 DIAGNOSIS — Y990 Civilian activity done for income or pay: Secondary | ICD-10-CM | POA: Diagnosis not present

## 2023-05-20 DIAGNOSIS — S01111A Laceration without foreign body of right eyelid and periocular area, initial encounter: Secondary | ICD-10-CM | POA: Diagnosis not present

## 2023-05-20 DIAGNOSIS — Y92512 Supermarket, store or market as the place of occurrence of the external cause: Secondary | ICD-10-CM | POA: Diagnosis not present

## 2023-05-20 DIAGNOSIS — W01198A Fall on same level from slipping, tripping and stumbling with subsequent striking against other object, initial encounter: Secondary | ICD-10-CM | POA: Insufficient documentation

## 2023-05-20 DIAGNOSIS — S0993XA Unspecified injury of face, initial encounter: Secondary | ICD-10-CM | POA: Diagnosis not present

## 2023-05-20 DIAGNOSIS — S0181XA Laceration without foreign body of other part of head, initial encounter: Secondary | ICD-10-CM

## 2023-05-20 DIAGNOSIS — R220 Localized swelling, mass and lump, head: Secondary | ICD-10-CM | POA: Diagnosis not present

## 2023-05-20 LAB — CBC WITH DIFFERENTIAL/PLATELET
Abs Immature Granulocytes: 0.03 10*3/uL (ref 0.00–0.07)
Basophils Absolute: 0.1 10*3/uL (ref 0.0–0.1)
Basophils Relative: 1 %
Eosinophils Absolute: 0.1 10*3/uL (ref 0.0–0.5)
Eosinophils Relative: 1 %
HCT: 37.1 % (ref 36.0–46.0)
Hemoglobin: 11.6 g/dL — ABNORMAL LOW (ref 12.0–15.0)
Immature Granulocytes: 0 %
Lymphocytes Relative: 22 %
Lymphs Abs: 1.8 10*3/uL (ref 0.7–4.0)
MCH: 27.2 pg (ref 26.0–34.0)
MCHC: 31.3 g/dL (ref 30.0–36.0)
MCV: 87.1 fL (ref 80.0–100.0)
Monocytes Absolute: 0.5 10*3/uL (ref 0.1–1.0)
Monocytes Relative: 6 %
Neutro Abs: 5.8 10*3/uL (ref 1.7–7.7)
Neutrophils Relative %: 70 %
Platelets: 300 10*3/uL (ref 150–400)
RBC: 4.26 MIL/uL (ref 3.87–5.11)
RDW: 14.2 % (ref 11.5–15.5)
WBC: 8.2 10*3/uL (ref 4.0–10.5)
nRBC: 0 % (ref 0.0–0.2)

## 2023-05-20 LAB — TROPONIN I (HIGH SENSITIVITY): Troponin I (High Sensitivity): 4 ng/L (ref <18)

## 2023-05-20 LAB — COMPREHENSIVE METABOLIC PANEL
ALT: 11 U/L (ref 0–44)
AST: 19 U/L (ref 15–41)
Albumin: 4.2 g/dL (ref 3.5–5.0)
Alkaline Phosphatase: 56 U/L (ref 38–126)
Anion gap: 10 (ref 5–15)
BUN: 14 mg/dL (ref 8–23)
CO2: 25 mmol/L (ref 22–32)
Calcium: 9 mg/dL (ref 8.9–10.3)
Chloride: 103 mmol/L (ref 98–111)
Creatinine, Ser: 0.59 mg/dL (ref 0.44–1.00)
GFR, Estimated: >60 mL/min (ref >60)
Glucose, Bld: 88 mg/dL (ref 70–99)
Potassium: 3.5 mmol/L (ref 3.5–5.1)
Sodium: 138 mmol/L (ref 135–145)
Total Bilirubin: 0.5 mg/dL (ref <1.2)
Total Protein: 7.7 g/dL (ref 6.5–8.1)

## 2023-05-20 MED ORDER — LIDOCAINE HCL (PF) 1 % IJ SOLN
5.0000 mL | Freq: Once | INTRAMUSCULAR | Status: AC
  Administered 2023-05-21: 5 mL
  Filled 2023-05-20: qty 5

## 2023-05-20 NOTE — ED Notes (Signed)
PT advised me this visit should be workmans comp since it happened at work, however, pt does not have any paperwork to advise of any tests that may be needed, I ask pt does she have someone she can call to verbally confirm and pt advised no.

## 2023-05-20 NOTE — ED Triage Notes (Signed)
Pt reports she had syncopal episode at work, pt fell face first into ground. Pt has bruising to right eye and small laceration noted to eyelid. Bleeding controlled. Pt states she passed out due to hitting her elbow on a piece of metal and the intense pain she was having caused her to have LOC.

## 2023-05-20 NOTE — ED Provider Notes (Signed)
[facility] Provider Note    Event Date/Time   First MD Initiated Contact with Patient 05/20/23 2316     (approximate)   History   Loss of Consciousness   HPI  Melody Hicks is a 62 y.o. female who presents to the ED for evaluation of Loss of Consciousness   Review of PCP visit from May. History of HTN, kidney stones, anxiety  Patient presents from her workplace after she struck her elbow on a shopping cart, had a lot of pain and subsequently a syncopal episode causing head injury and a small laceration to her right brow.  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  She struck her ulnar right elbow on a metal shopping cart, "really got my funny bone" and she developed severe pain.  Reports her vision getting blurry, lightheaded and she passed out.  Reports face planting on the concrete floor.  No reported seizure activity.  Here in the ED, she reports feeling much better.    Physical Exam   Triage Vital Signs: ED Triage Vitals  Encounter Vitals Group     BP 05/20/23 2008 (!) 142/85     Systolic BP Percentile --      Diastolic BP Percentile --      Pulse Rate 05/20/23 2014 80     Resp 05/20/23 2008 16     Temp 05/20/23 2008 98.6 F (37 C)     Temp src --      SpO2 05/20/23 2008 95 %     Weight 05/20/23 2008 120 lb (54.4 kg)     Height 05/20/23 2008 5\' 4"  (1.626 m)     Head Circumference --      Peak Flow --      Pain Score 05/20/23 2012 0     Pain Loc --      Pain Education --      Exclude from Growth Chart --     Most recent vital signs: Vitals:   05/20/23 2008 05/20/23 2014  BP: (!) 142/85   Pulse:  80  Resp: 16   Temp: 98.6 F (37 C)   SpO2: 95%     General: Awake, no distress.  CV:  Good peripheral perfusion.  Resp:  Normal effort.  Abd:  No distention.  MSK:  No deformity noted.  Neuro:  No focal deficits appreciated. Cranial nerves II through XII intact 5/5 strength and sensation in all 4  extremities Other:  Small, 1.5 cm laceration into the subcutaneous tissue of the right brow superior and lateral to the right globe.  Globes intact, pupils are PERRL.  No proptosis.  No identifiable glandular tissue.  Hemostatic.  No step-offs.   ED Results / Procedures / Treatments   Labs (all labs ordered are listed, but only abnormal results are displayed) Labs Reviewed  CBC WITH DIFFERENTIAL/PLATELET - Abnormal; Notable for the following components:      Result Value   Hemoglobin 11.6 (*)    All other components within normal limits  COMPREHENSIVE METABOLIC PANEL  TROPONIN I (HIGH SENSITIVITY)    EKG Sinus rhythm with a rate of 79 bpm.  Normal axis and intervals.  No clear signs of acute ischemia.  RADIOLOGY CT head interpreted by me without evidence of acute intracranial pathology CT max face interpreted by me without evidence of fracture  Official radiology report(s): CT MAXILLOFACIAL WO CONTRAST  Result Date: 05/20/2023 CLINICAL DATA:  Syncope with subsequent fall. EXAM: CT MAXILLOFACIAL WITHOUT CONTRAST  TECHNIQUE: Multidetector CT imaging of the maxillofacial structures was performed. Multiplanar CT image reconstructions were also generated. RADIATION DOSE REDUCTION: This exam was performed according to the departmental dose-optimization program which includes automated exposure control, adjustment of the mA and/or kV according to patient size and/or use of iterative reconstruction technique. COMPARISON:  None Available. FINDINGS: Osseous: No fracture or mandibular dislocation. No destructive process. Orbits: Negative. No traumatic or inflammatory finding. Sinuses: Clear. Soft tissues: There is mild to moderate severity right-sided facial, mild lateral right periorbital and mild right supra orbital soft tissue swelling. Limited intracranial: No significant or unexpected finding. IMPRESSION: 1. Mild to moderate severity right-sided facial, mild lateral right periorbital and mild  right supra orbital soft tissue swelling. 2. No acute facial bone fracture. Electronically Signed   By: Aram Candela M.D.   On: 05/20/2023 21:25   CT Head Wo Contrast  Result Date: 05/20/2023 CLINICAL DATA:  Syncopal episode and subsequent fall. EXAM: CT HEAD WITHOUT CONTRAST TECHNIQUE: Contiguous axial images were obtained from the base of the skull through the vertex without intravenous contrast. RADIATION DOSE REDUCTION: This exam was performed according to the departmental dose-optimization program which includes automated exposure control, adjustment of the mA and/or kV according to patient size and/or use of iterative reconstruction technique. COMPARISON:  None Available. FINDINGS: Brain: No evidence of acute infarction, hemorrhage, hydrocephalus, extra-axial collection or mass lesion/mass effect. Vascular: No hyperdense vessel or unexpected calcification. Skull: Normal. Negative for fracture or focal lesion. Sinuses/Orbits: No acute finding. Other: There is mild to moderate severity right-sided facial and mild right supra orbital soft tissue swelling. IMPRESSION: 1. No acute intracranial abnormality. 2. Mild to moderate severity right-sided facial and mild right supra orbital soft tissue swelling. Electronically Signed   By: Aram Candela M.D.   On: 05/20/2023 21:22    PROCEDURES and INTERVENTIONS:  .Laceration Repair  Date/Time: 05/21/2023 12:25 AM  Performed by: Delton Prairie, MD Authorized by: Delton Prairie, MD   Consent:    Consent obtained:  Verbal   Consent given by:  Patient   Risks, benefits, and alternatives were discussed: yes   Anesthesia:    Anesthesia method:  Local infiltration   Local anesthetic:  Lidocaine 1% w/o epi Laceration details:    Location:  Face   Face location:  R eyebrow   Length (cm):  1.5 Exploration:    Imaging obtained comment:  CT   Imaging outcome: foreign body not noted     Contaminated: no   Treatment:    Area cleansed with:   Povidone-iodine   Amount of cleaning:  Standard   Irrigation solution:  Sterile saline Skin repair:    Repair method:  Sutures   Suture size:  4-0   Wound skin closure material used: monocryl.   Suture technique:  Horizontal mattress   Number of sutures:  1 Approximation:    Approximation:  Close Repair type:    Repair type:  Simple Post-procedure details:    Dressing:  Open (no dressing)   Procedure completion:  Tolerated well, no immediate complications   Medications  lidocaine (PF) (XYLOCAINE) 1 % injection 5 mL (5 mLs Infiltration Given 05/21/23 0003)     IMPRESSION / MDM / ASSESSMENT AND PLAN / ED COURSE  I reviewed the triage vital signs and the nursing notes.  Differential diagnosis includes, but is not limited to, cardiac dysrhythmia, seizure, vasovagal  {Patient presents with symptoms of an acute illness or injury that is potentially life-threatening.  Patient presents after syncopal episode  at work, likely vasovagal episode in the setting of transient pain.  Face planted causing a small laceration to the brow suitable for bedside repair.  No neurologic deficits or signs of additional trauma.  No evidence of globe injury.  Reassuring imaging.  EKG without concerning interval changes and screening blood work is benign.  I considered observation admission for this patient but doubt more serious pathology.  Discharged with return precautions      FINAL CLINICAL IMPRESSION(S) / ED DIAGNOSES   Final diagnoses:  Vasovagal episode  Syncope and collapse  Laceration of brow without complication, initial encounter     Rx / DC Orders   ED Discharge Orders     None        Note:  This document was prepared using Dragon voice recognition software and may include unintentional dictation errors.   Delton Prairie, MD 05/21/23 0100

## 2023-05-21 NOTE — Discharge Instructions (Signed)
1 stitch that will absorb on its own  Gently wash the wound with soap and water.  It is okay to shower, but do not submerge in a bath or go swimming as it is healing.  Do not vigorously scrub.   Gently pat dry.   Once dry, then apply Neosporin or bacitracin or even Vaseline ointment to the area to act as a barrier to help prevent infection.  Use Tylenol for pain and fevers.  Up to 1000 mg per dose, up to 4 times per day.  Do not take more than 4000 mg of Tylenol/acetaminophen within 24 hours.Marland Kitchen

## 2023-05-23 NOTE — Telephone Encounter (Signed)
Kelli, will you print all forms?  I have asked patient to set up an appointment.

## 2023-05-24 NOTE — Telephone Encounter (Signed)
Forms have been printed. Will hold in my desk until patients appt.

## 2023-05-25 ENCOUNTER — Encounter: Payer: Self-pay | Admitting: Primary Care

## 2023-05-25 ENCOUNTER — Ambulatory Visit: Payer: Federal, State, Local not specified - PPO | Admitting: Primary Care

## 2023-05-25 VITALS — BP 136/68 | HR 75 | Temp 98.2°F | Ht 64.0 in | Wt 123.0 lb

## 2023-05-25 DIAGNOSIS — R55 Syncope and collapse: Secondary | ICD-10-CM

## 2023-05-25 HISTORY — DX: Syncope and collapse: R55

## 2023-05-25 NOTE — Progress Notes (Signed)
Subjective:    Patient ID: Melody Hicks, female    DOB: August 20, 1960, 62 y.o.   MRN: [id number]  HPI  Melody Hicks is a very pleasant 62 y.o. female with a history of hypertension, chronic meniscal tear of the knee, right shoulder pain who presents today for ED follow-up and medical leave.  She presented to [facility] ED on 05/20/2023 for loss of consciousness.  She was at work, struck her right elbow on a shopping cart with subsequent pain and syncopal episode.  She fell and hit her head face forward on concrete sustaining a small laceration to her right brow.  During her stay in the ED she underwent CT head and CT maxillofacial which were both negative for fracture or intracranial bleed.  She had 1 dissolvable suture placed to her right eyebrow.  Additional workup was negative.  She was discharged home later that evening.  She is not ready to return to work due to bilateral facial swelling under the eyes and cheeks. She continues to experience right sided facial tenderness. She has had a mild headache to the left occipital lobe, she isn't sure if this is from the hair dresser burning her scalp. She's been using an ice pack and ibuprofen. She denies blurred vision, headaches today, eye lid drainage.   She will be requesting financial assistance from her employer for her hospital and doctor's office co-pays.  She is needing paperwork completed today.   Review of Systems  Eyes:  Negative for visual disturbance.  Respiratory:  Negative for shortness of breath.   Cardiovascular:  Negative for chest pain.  Skin:  Positive for color change and wound.  Neurological:  Negative for dizziness and headaches.         Past Medical History:  Diagnosis Date   Anxiety    Herniated disc, cervical    Hypertension    Kidney stone on left side 08/16/2022   Right facial swelling 08/06/2019    Social History   Socioeconomic History   Marital status: Single    Spouse name: Not on file   Number  of children: Not on file   Years of education: Not on file   Highest education level: Not on file  Occupational History   Not on file  Tobacco Use   Smoking status: Former   Smokeless tobacco: Never  Vaping Use   Vaping status: Never Used  Substance and Sexual Activity   Alcohol use: Not Currently   Drug use: Never   Sexual activity: Not Currently  Other Topics Concern   Not on file  Social History Narrative   Single.   1 son. 1 grandchild.   Working part time.   Social Determinants of Health   Financial Resource Strain: Not on file  Food Insecurity: Not on file  Transportation Needs: Not on file  Physical Activity: Not on file  Stress: Not on file  Social Connections: Not on file  Intimate Partner Violence: Not on file    Past Surgical History:  Procedure Laterality Date   LYMPH NODE BIOPSY      Family History  Problem Relation Age of Onset   Diabetes Mother    Stroke Mother    Hypertension Mother    Heart disease Mother    Hypertension Father    Stroke Father     Allergies  Allergen Reactions   Latex Itching   Lisinopril Other (See Comments)    Other reaction(s): Hypotension Low bp,    Iodinated Contrast  Media Nausea And Vomiting    Current Outpatient Medications on File Prior to Visit  Medication Sig Dispense Refill   amLODipine (NORVASC) 5 MG tablet Take 1 tablet by mouth once daily for blood pressure 90 tablet 2   ascorbic acid (VITAMIN C) 500 MG tablet Take 500 mg by mouth.     aspirin 81 MG EC tablet Take by mouth.     Cholecalciferol 100 MCG (4000 UT) CAPS Take 2,000 Int'l Units by mouth daily.     Omega-3 Fatty Acids (FISH OIL) 1000 MG CAPS Take by mouth.     OVER THE COUNTER MEDICATION Sea moss     TURMERIC CURCUMIN PO Take by mouth.     vitamin B-12 (CYANOCOBALAMIN) 1000 MCG tablet Take 1,000 mcg by mouth daily.     No current facility-administered medications on file prior to visit.    BP 136/68   Pulse 75   Temp 98.2 F (36.8 C)  (Temporal)   Ht 5\' 4"  (1.626 m)   Wt 123 lb (55.8 kg)   SpO2 98%   BMI 21.11 kg/m  Objective:   Physical Exam Eyes:     Extraocular Movements: Extraocular movements intact.  Cardiovascular:     Rate and Rhythm: Normal rate and regular rhythm.  Pulmonary:     Effort: Pulmonary effort is normal.     Breath sounds: Normal breath sounds.  Musculoskeletal:     Cervical back: Neck supple.  Skin:    General: Skin is warm and dry.     Findings: Bruising present.     Comments: Mild to moderate bruising noted to bilateral lower orbital region. Moderate swelling to right lower orbital region and cheek.  Mild swelling to left lower orbital region and cheek.   Healing right eye lid laceration noted. No drainage.   Neurological:     Mental Status: She is alert and oriented to person, place, and time.     Cranial Nerves: No cranial nerve deficit.  Psychiatric:        Mood and Affect: Mood normal.           Assessment & Plan:  Syncope and collapse Assessment & Plan: While at work which resulted in injuries.  Reviewed ED notes, labs, and imaging.  Determined vasovagal from injury.  Exam today reassuring.  Agree to provide work accomodation to remain out of work starting 05/20/2023 through 06/02/2023 with return to work date of 06/03/2023.  Paperwork completed today in the office. Work note also provided.          Doreene Nest, NP

## 2023-05-25 NOTE — Patient Instructions (Signed)
It was a pleasure to see you today!   

## 2023-05-25 NOTE — Assessment & Plan Note (Addendum)
While at work which resulted in injuries.  Reviewed ED notes, labs, and imaging.  Determined vasovagal from injury.  Exam today reassuring.  Agree to provide work accomodation to remain out of work starting 05/20/2023 through 06/02/2023 with return to work date of 06/03/2023.  Paperwork completed today in the office. Work note also provided.

## 2023-06-08 DIAGNOSIS — H40023 Open angle with borderline findings, high risk, bilateral: Secondary | ICD-10-CM | POA: Diagnosis not present

## 2023-06-08 DIAGNOSIS — H2513 Age-related nuclear cataract, bilateral: Secondary | ICD-10-CM | POA: Diagnosis not present

## 2023-06-08 DIAGNOSIS — H40053 Ocular hypertension, bilateral: Secondary | ICD-10-CM | POA: Diagnosis not present

## 2023-07-06 IMAGING — DX DG KNEE 3 VIEWS*R*
3 series · 3 of 3 positions shown · non-contrast
Comparison: None.

CLINICAL DATA: Acute knee pain

EXAM:
RIGHT KNEE - 3 VIEW

[knee ap]
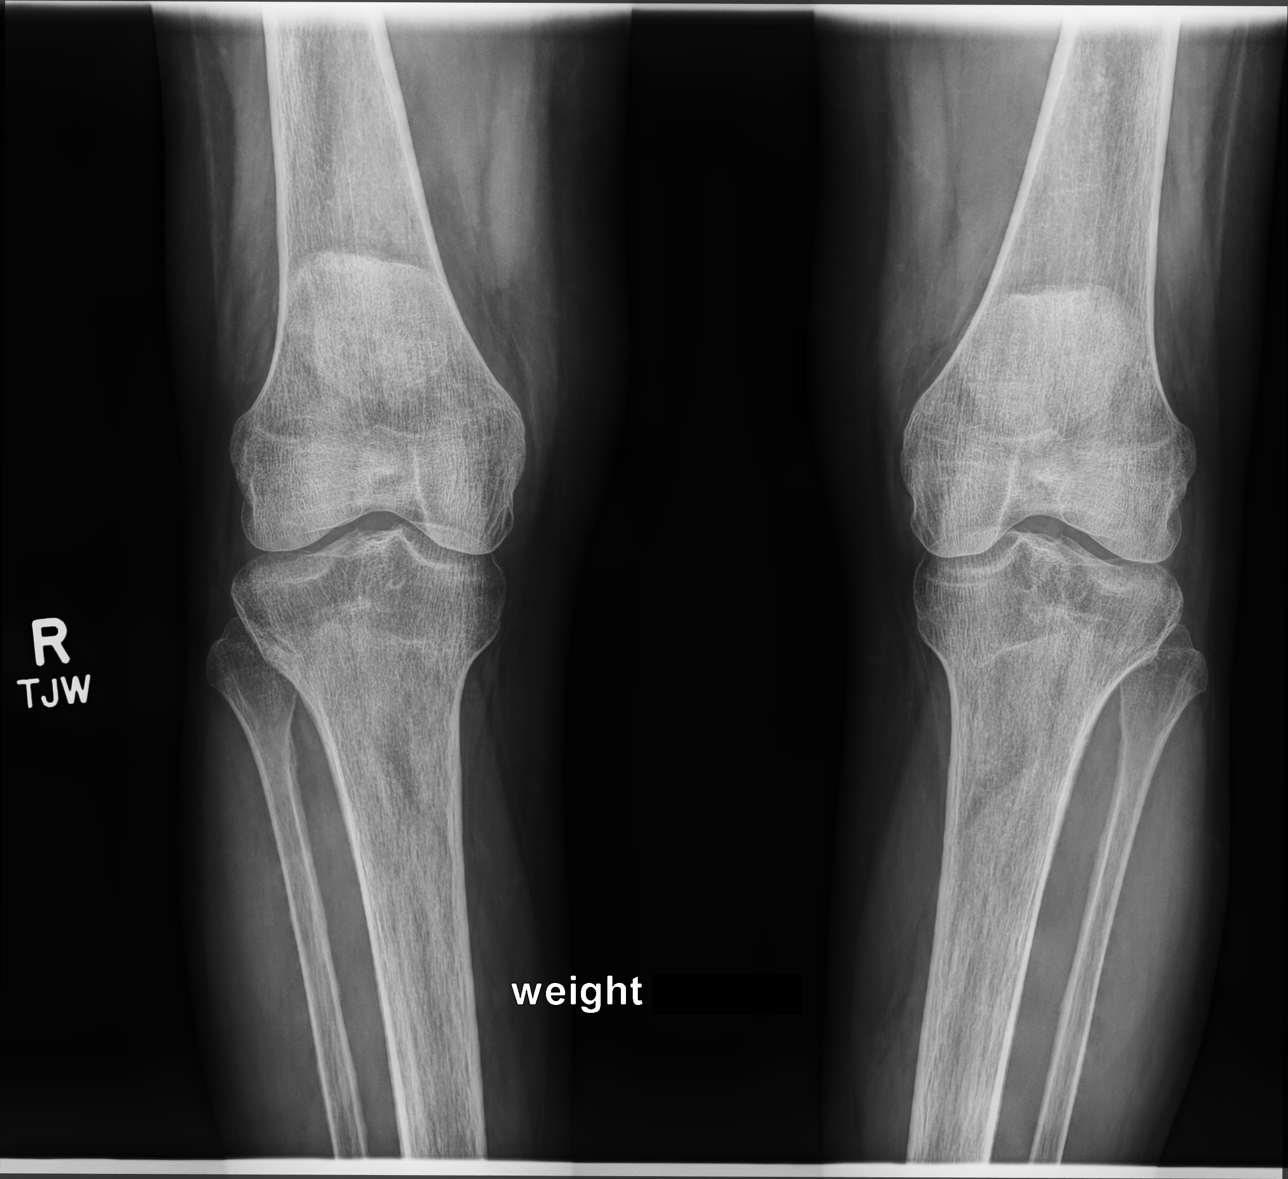

[knee lat]
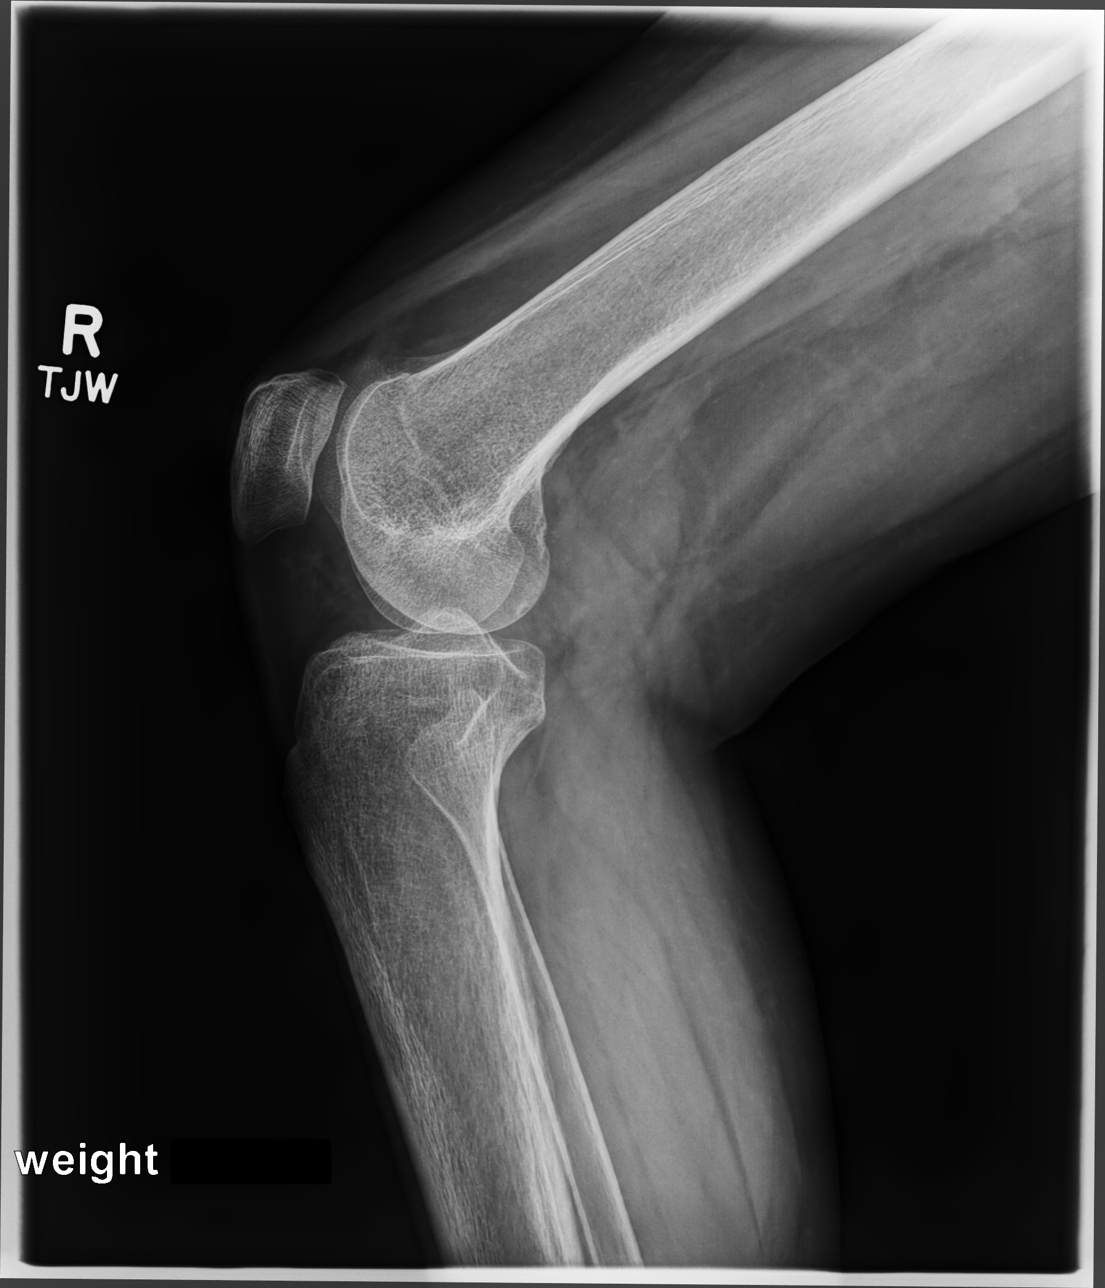

[patella (sunrise)]
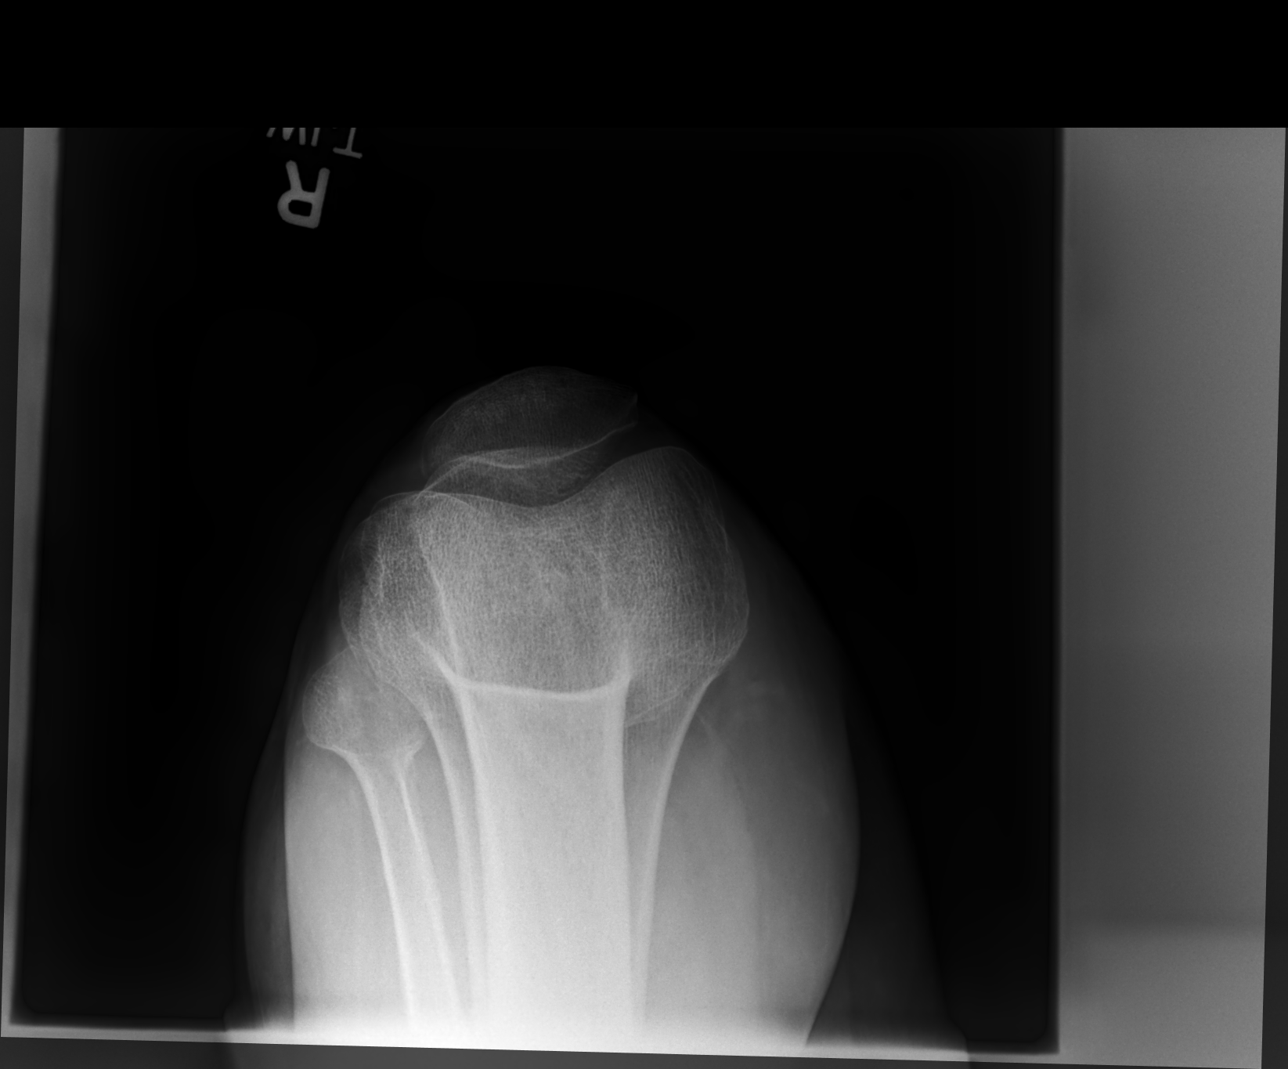

[3 of 3 positions shown; findings below may reference images not displayed]

FINDINGS: Decreased osseous mineralization. Alignment is anatomic. No acute
fracture. No joint effusion. Joint spaces are preserved.
IMPRESSION: No acute abnormality.

## 2023-09-20 ENCOUNTER — Encounter: Payer: Self-pay | Admitting: Dermatology

## 2023-09-20 ENCOUNTER — Ambulatory Visit: Payer: Federal, State, Local not specified - PPO | Admitting: Dermatology

## 2023-09-20 VITALS — BP 149/74

## 2023-09-20 DIAGNOSIS — L810 Postinflammatory hyperpigmentation: Secondary | ICD-10-CM

## 2023-09-20 DIAGNOSIS — L700 Acne vulgaris: Secondary | ICD-10-CM

## 2023-09-20 MED ORDER — SPIRONOLACTONE 50 MG PO TABS: 50.0000 mg | ORAL_TABLET | Freq: Every day | ORAL | 4 refills | Status: DC

## 2023-09-20 NOTE — Patient Instructions (Addendum)
 Hello  [place],  Thank you for visiting today. Here is a summary of the key instructions:  - Medications:   - Continue using ClearStim (salicylic acid) for acne   - Start spironolactone 50 mg once a day at night with dinner for hormonal acne   - Use Eucerin Radiant Tone cream (with thiamidol) twice a day for dark circles under eyes  - Skincare:   - Apply sunscreen daily to prevent darkening of skin   - Consider using Neutrogena mineral sunscreens with zinc oxide or titanium dioxide  - Side Effects to Watch For:   - Look out for lightheadedness or dizziness with spironolactone  - Follow-up:   - Check back in a couple of months for acne treatment progress  Please reach out if you have any questions or concerns.  Warm regards,  Dr. Langston Reusing, Dermatology         Important Information  Due to recent changes in healthcare laws, you may see results of your pathology and/or laboratory studies on MyChart before the doctors have had a chance to review them. We understand that in some cases there may be results that are confusing or concerning to you. Please understand that not all results are received at the same time and often the doctors may need to interpret multiple results in order to provide you with the best plan of care or course of treatment. Therefore, we ask that you please give [place] 2 business days to thoroughly review all your results before contacting the office for clarification. Should we see a critical lab result, you will be contacted sooner.   If You Need Anything After Your Visit  If you have any questions or concerns for your doctor, please call our main line at [phone] If no one answers, please leave a voicemail as directed and we will return your call as soon as possible. Messages left after 4 pm will be answered the following business day.   You may also send [place] a message via MyChart. We typically respond to MyChart messages within 1-2 business  days.  For prescription refills, please ask your pharmacy to contact our office. Our fax number is [phone].  If you have an urgent issue when the clinic is closed that cannot wait until the next business day, you can page your doctor at the number below.    Please note that while we do our best to be available for urgent issues outside of office hours, we are not available 24/7.   If you have an urgent issue and are unable to reach [place], you may choose to seek medical care at your doctor's office, retail clinic, urgent care center, or emergency room.  If you have a medical emergency, please immediately call 911 or go to the emergency department. In the event of inclement weather, please call our main line at [phone] for an update on the status of any delays or closures.  Dermatology Medication Tips: Please keep the boxes that topical medications come in in order to help keep track of the instructions about where and how to use these. Pharmacies typically print the medication instructions only on the boxes and not directly on the medication tubes.   If your medication is too expensive, please contact our office at [phone] or send [place] a message through MyChart.   We are unable to tell what your co-pay for medications will be in advance as this is different depending on your insurance coverage. However, we may be  able to find a substitute medication at lower cost or fill out paperwork to get insurance to cover a needed medication.   If a prior authorization is required to get your medication covered by your insurance company, please allow [place] 1-2 business days to complete this process.  Drug prices often vary depending on where the prescription is filled and some pharmacies may offer cheaper prices.  The website www.goodrx.com contains coupons for medications through different pharmacies. The prices here do not account for what the cost may be with help from insurance (it may be  cheaper with your insurance), but the website can give you the price if you did not use any insurance.  - You can print the associated coupon and take it with your prescription to the pharmacy.  - You may also stop by our office during regular business hours and pick up a GoodRx coupon card.  - If you need your prescription sent electronically to a different pharmacy, notify our office through [facility] or by phone at [phone]
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
## 2023-10-10 IMAGING — MR MR KNEE*R* W/O CM
6 series · 40 of 40 positions shown · non-contrast
Comparison: Radiographs 07/07/2021

CLINICAL DATA: Knee pain and swelling since May 2021.

EXAM:
MRI OF THE RIGHT KNEE WITHOUT CONTRAST
TECHNIQUE: Multiplanar, multisequence MR imaging of the knee was performed. No
intravenous contrast was administered.

[Series 8: T2 fat-sat · axial · right · 4.0mm · 0.50mm/px · z∈[-66,+57]mm · 4 of 26 slices shown (1 of 3)]
[im 1/26]
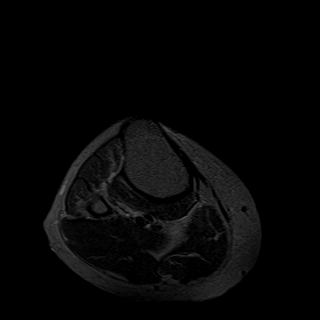
[im 9/26]
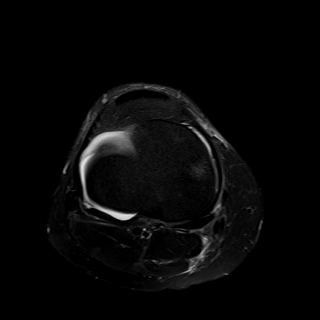
[im 17/26]
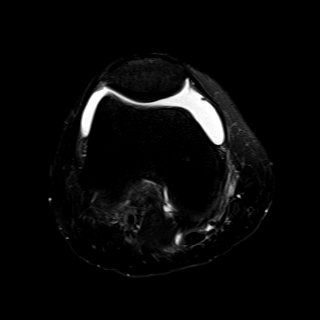
[im 26/26]
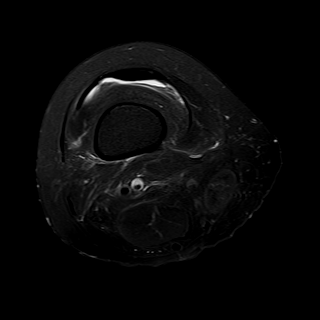

[Series 9: T1 · coronal · right · 4.0mm · 0.47mm/px · 7 of 32 slices shown]
[im 1/32]
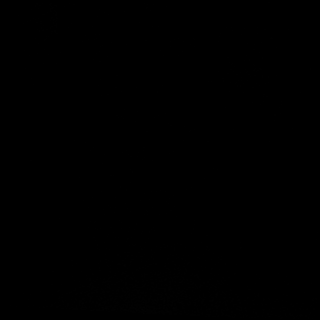
[im 6/32]
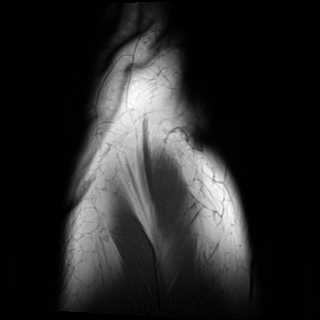
[im 11/32]
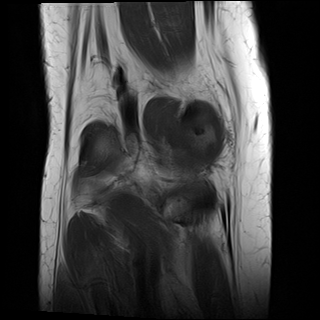
[im 16/32]
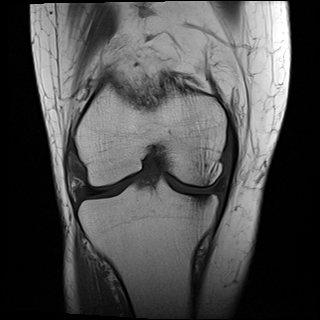
[im 21/32]
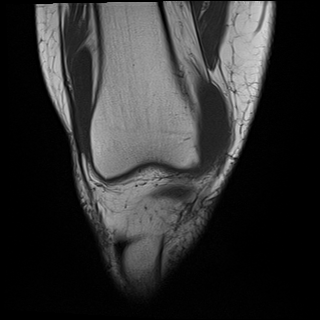
[im 26/32]
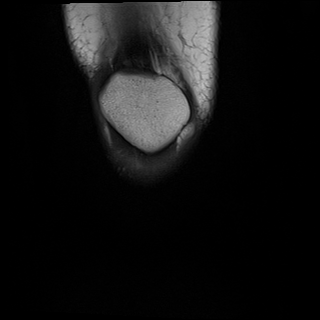
[im 32/32]
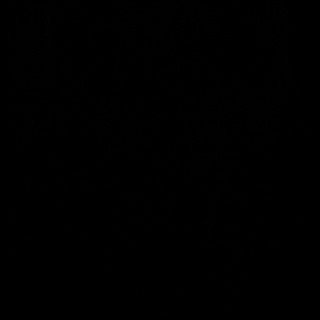

[Series 10: T2 fat-sat · coronal · right · 4.0mm · 0.47mm/px · 7 of 32 slices shown (2 of 3)]
[im 1/32]
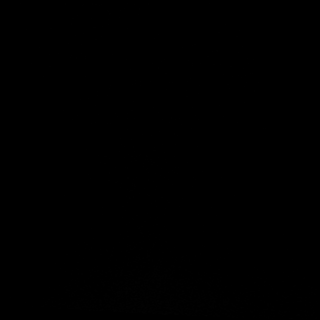
[im 6/32]
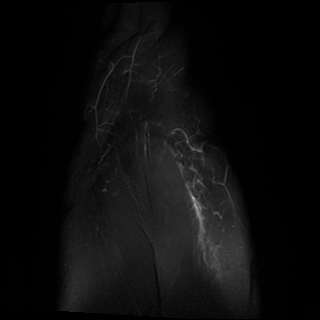
[im 11/32]
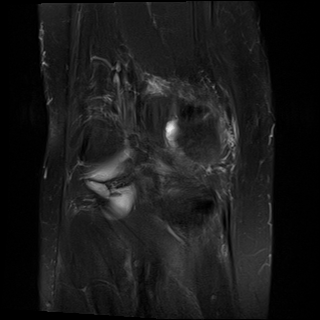
[im 16/32]
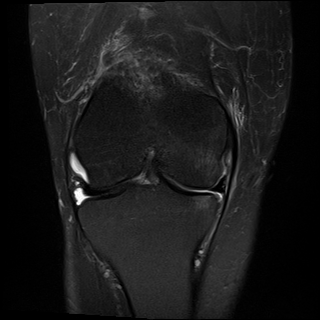
[im 21/32]
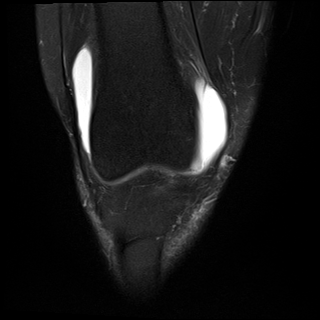
[im 26/32]
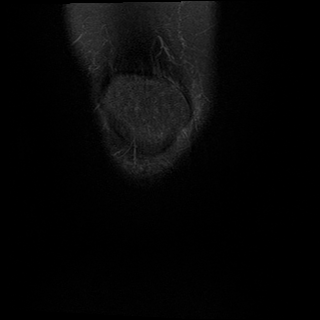
[im 32/32]
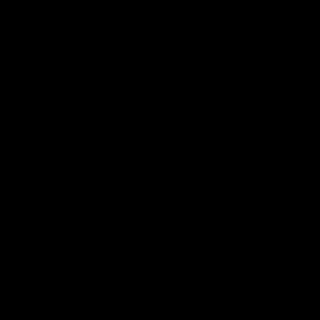

[Series 11: PD fat-sat · coronal · right · 4.0mm · 0.59mm/px · 7 of 32 slices shown (1 of 2)]
[im 1/32]
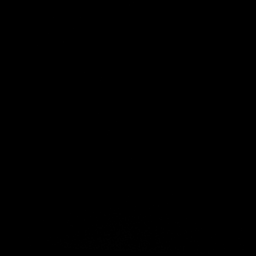
[im 6/32]
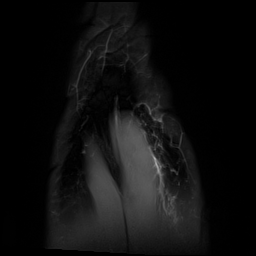
[im 11/32]
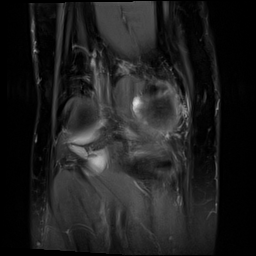
[im 16/32]
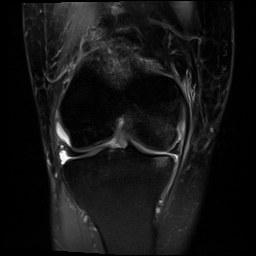
[im 21/32]
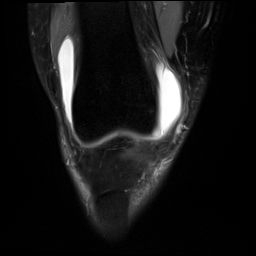
[im 26/32]
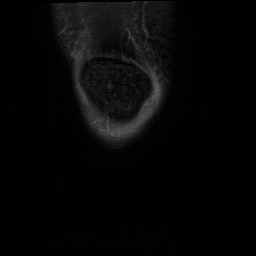
[im 32/32]
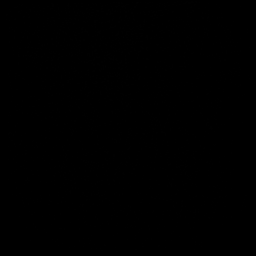

[Series 12: PD fat-sat · sagittal · right · 3.0mm · 0.47mm/px · 7 of 33 slices shown (2 of 2)]
[im 1/33]
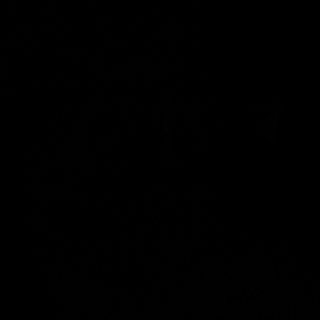
[im 6/33]
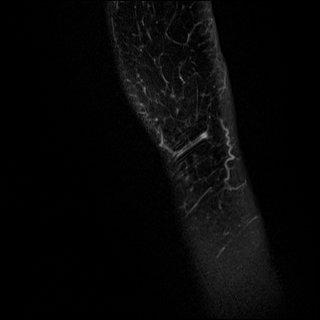
[im 11/33]
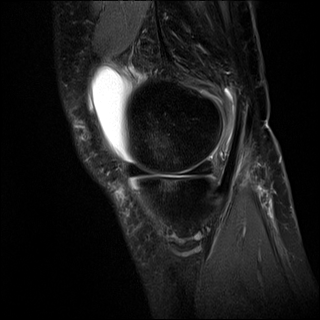
[im 17/33]
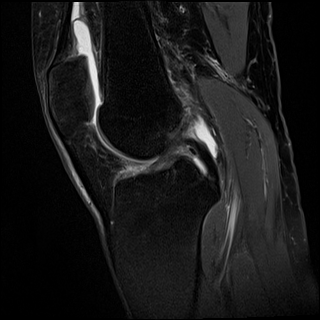
[im 22/33]
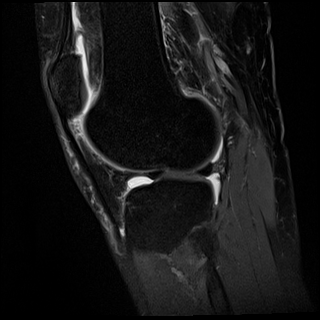
[im 27/33]
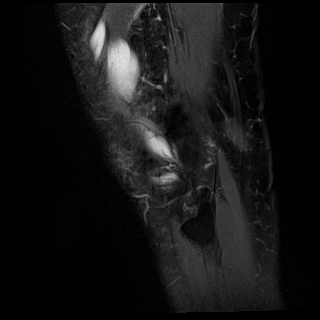
[im 33/33]
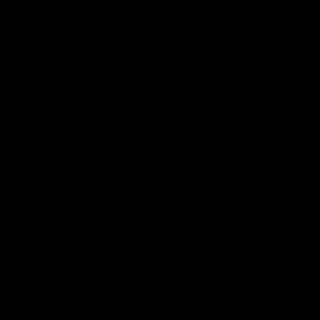

[Series 13: T2 fat-sat · sagittal · right · 3.0mm · 0.47mm/px · 8 of 36 slices shown (3 of 3)]
[im 1/36]
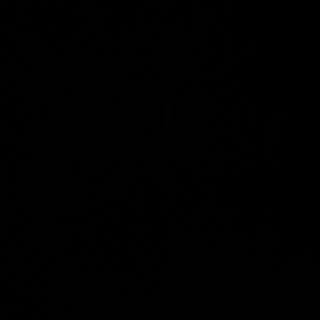
[im 6/36]
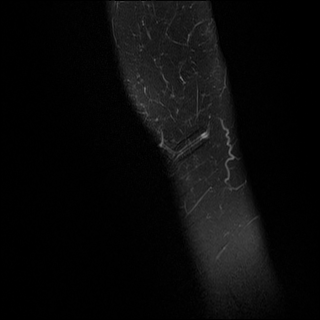
[im 11/36]
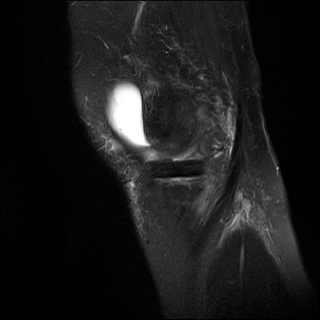
[im 16/36]
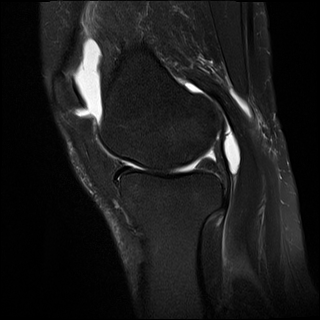
[im 21/36]
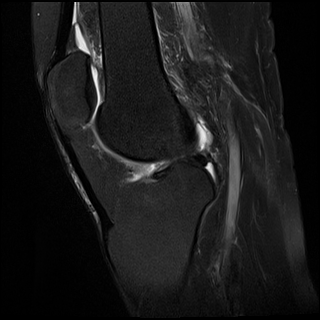
[im 26/36]
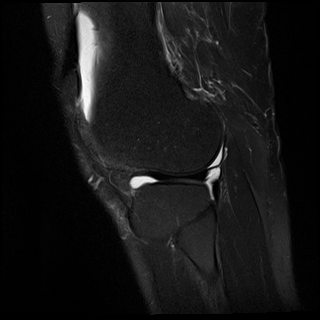
[im 31/36]
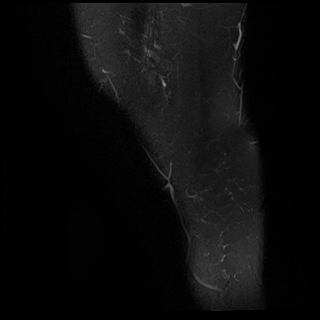
[im 36/36]
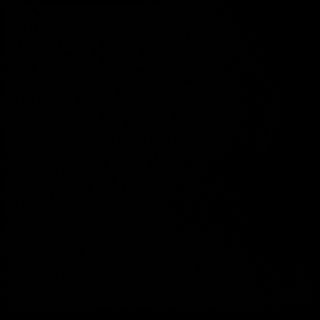

[40 of 40 positions shown; findings below may reference images not displayed]

FINDINGS: MENISCI

Medial meniscus: There is a partial-thickness radial tear back near
the meniscal root with partial detachment and medial protrusion of
the meniscus estimated at 4 mm. Increased intrasubstance T2 signal
intensity suggesting associated typical mucinous degeneration.

Lateral meniscus:  Intact

LIGAMENTS

Cruciates:  Intact

Collaterals:  Intact

CARTILAGE

Patellofemoral:  Normal

Medial: Mild degenerative chondrosis with cartilage thinning, mild
joint space narrowing and associated marrow edema.

Lateral:  Normal

Joint:  Moderate to large joint effusion.

Popliteal Fossa:  Small leaking Baker's cyst.

Extensor Mechanism: The patella retinacular structures are intact
and the quadriceps and patellar tendons are intact.

Bones:  No acute bony findings

Other: Unremarkable knee musculature.
IMPRESSION: 1. Partial-thickness radial tear back near the meniscal root with
partial detachment and medial protrusion of the meniscus estimated
at 4 mm.
2. Intact ligamentous structures and no acute bony findings.
3. Mild medial compartment degenerative chondrosis.
4. Moderate to large joint effusion and small leaking Baker's cyst.

## 2023-11-15 ENCOUNTER — Other Ambulatory Visit: Payer: Self-pay | Admitting: Primary Care

## 2023-11-15 DIAGNOSIS — I10 Essential (primary) hypertension: Secondary | ICD-10-CM

## 2023-11-23 ENCOUNTER — Ambulatory Visit: Payer: Self-pay | Admitting: Primary Care

## 2023-11-23 ENCOUNTER — Encounter: Payer: Self-pay | Admitting: Primary Care

## 2023-11-23 ENCOUNTER — Ambulatory Visit (INDEPENDENT_AMBULATORY_CARE_PROVIDER_SITE_OTHER): Admitting: Primary Care

## 2023-11-23 VITALS — BP 132/68 | HR 80 | Temp 97.2°F | Ht 64.0 in | Wt 121.0 lb

## 2023-11-23 DIAGNOSIS — I10 Essential (primary) hypertension: Secondary | ICD-10-CM

## 2023-11-23 DIAGNOSIS — E2839 Other primary ovarian failure: Secondary | ICD-10-CM | POA: Diagnosis not present

## 2023-11-23 DIAGNOSIS — Z0000 Encounter for general adult medical examination without abnormal findings: Secondary | ICD-10-CM

## 2023-11-23 DIAGNOSIS — Z1231 Encounter for screening mammogram for malignant neoplasm of breast: Secondary | ICD-10-CM

## 2023-11-23 DIAGNOSIS — E559 Vitamin D deficiency, unspecified: Secondary | ICD-10-CM | POA: Diagnosis not present

## 2023-11-23 DIAGNOSIS — Z1211 Encounter for screening for malignant neoplasm of colon: Secondary | ICD-10-CM

## 2023-11-23 LAB — LIPID PANEL
Cholesterol: 176 mg/dL (ref 0–200)
HDL: 56.2 mg/dL (ref >39.00)
LDL Cholesterol: 108 mg/dL — ABNORMAL HIGH (ref 0–99)
NonHDL: 119.77
Total CHOL/HDL Ratio: 3
Triglycerides: 58 mg/dL (ref 0.0–149.0)
VLDL: 11.6 mg/dL (ref 0.0–40.0)

## 2023-11-23 LAB — COMPREHENSIVE METABOLIC PANEL WITH GFR
ALT: 10 U/L (ref 0–35)
AST: 16 U/L (ref 0–37)
Albumin: 4.7 g/dL (ref 3.5–5.2)
Alkaline Phosphatase: 54 U/L (ref 39–117)
BUN: 10 mg/dL (ref 6–23)
CO2: 27 meq/L (ref 19–32)
Calcium: 10.1 mg/dL (ref 8.4–10.5)
Chloride: 103 meq/L (ref 96–112)
Creatinine, Ser: 0.77 mg/dL (ref 0.40–1.20)
GFR: 81.08 mL/min (ref >60.00)
Glucose, Bld: 87 mg/dL (ref 70–99)
Potassium: 4.6 meq/L (ref 3.5–5.1)
Sodium: 142 meq/L (ref 135–145)
Total Bilirubin: 0.5 mg/dL (ref 0.2–1.2)
Total Protein: 8 g/dL (ref 6.0–8.3)

## 2023-11-23 LAB — VITAMIN D 25 HYDROXY (VIT D DEFICIENCY, FRACTURES): VITD: 42.59 ng/mL (ref 30.00–100.00)

## 2023-11-23 NOTE — Progress Notes (Addendum)
 Subjective:    Patient ID: Melody Hicks, female    DOB: 02/10/1961, 63 y.o.   MRN: [id number]  HPI  Melody Hicks is a very pleasant 63 y.o. female who presents today for complete physical and follow up of chronic conditions.  Immunizations: -Tetanus: Completed in 2015  -Shingles: Never completed, declines  -Pneumonia: Never completed, declines  Diet: Fair diet.  Exercise: Regular exercise.  Eye exam: Completes annually  Dental exam: Completes semi-annually    Mammogram: Completed in July 2024 Bone Density Scan: Due  Colonoscopy: Completed in 2015, due 2025   BP Readings from Last 3 Encounters:  11/23/23 132/68  09/20/23 (!) 149/74  05/25/23 136/68      Review of Systems  Constitutional:  Negative for unexpected weight change.  HENT:  Negative for rhinorrhea.   Respiratory:  Negative for cough and shortness of breath.   Cardiovascular:  Negative for chest pain.  Gastrointestinal:  Negative for constipation and diarrhea.  Genitourinary:  Negative for difficulty urinating.  Musculoskeletal:  Positive for arthralgias. Negative for myalgias.  Skin:  Negative for rash.  Allergic/Immunologic: Negative for environmental allergies.  Neurological:  Negative for dizziness, numbness and headaches.  Psychiatric/Behavioral:  The patient is not nervous/anxious.          Past Medical History:  Diagnosis Date   Anxiety    Herniated disc, cervical    Hypertension    Kidney stone on left side 08/16/2022   Right facial swelling 08/06/2019   Right shoulder pain 09/24/2018   Salivary gland stone 03/17/2022   Syncope and collapse 05/25/2023    Social History   Socioeconomic History   Marital status: Single    Spouse name: Not on file   Number of children: Not on file   Years of education: Not on file   Highest education level: Not on file  Occupational History   Not on file  Tobacco Use   Smoking status: Former   Smokeless tobacco: Never  Vaping Use    Vaping status: Never Used  Substance and Sexual Activity   Alcohol use: Not Currently   Drug use: Never   Sexual activity: Not Currently  Other Topics Concern   Not on file  Social History Narrative   Single.   1 son. 1 grandchild.   Working part time.   Social Drivers of Corporate investment banker Strain: Not on file  Food Insecurity: Not on file  Transportation Needs: Not on file  Physical Activity: Not on file  Stress: Not on file  Social Connections: Not on file  Intimate Partner Violence: Not on file    Past Surgical History:  Procedure Laterality Date   LYMPH NODE BIOPSY      Family History  Problem Relation Age of Onset   Diabetes Mother    Stroke Mother    Hypertension Mother    Heart disease Mother    Hypertension Father    Stroke Father     Allergies  Allergen Reactions   Latex Itching   Lisinopril Other (See Comments)    Other reaction(s): Hypotension Low bp,    Iodinated Contrast Media Nausea And Vomiting    Current Outpatient Medications on File Prior to Visit  Medication Sig Dispense Refill   ascorbic acid (VITAMIN C) 500 MG tablet Take 500 mg by mouth.     aspirin 81 MG EC tablet Take by mouth.     Cholecalciferol 100 MCG (4000 UT) CAPS Take 2,000 Int'l Units by mouth  daily.     Omega-3 Fatty Acids (FISH OIL) 1000 MG CAPS Take by mouth.     OVER THE COUNTER MEDICATION Sea moss     TURMERIC CURCUMIN PO Take by mouth.     vitamin B-12 (CYANOCOBALAMIN) 1000 MCG tablet Take 1,000 mcg by mouth daily.     No current facility-administered medications on file prior to visit.    BP 132/68   Pulse 80   Temp (!) 97.2 F (36.2 C) (Temporal)   Ht 5\' 4"  (1.626 m)   Wt 121 lb (54.9 kg)   SpO2 100%   BMI 20.77 kg/m  Objective:   Physical Exam HENT:     Right Ear: Tympanic membrane and ear canal normal.     Left Ear: Tympanic membrane and ear canal normal.  Eyes:     Pupils: Pupils are equal, round, and reactive to light.  Cardiovascular:      Rate and Rhythm: Normal rate and regular rhythm.  Pulmonary:     Effort: Pulmonary effort is normal.     Breath sounds: Normal breath sounds.  Abdominal:     General: Bowel sounds are normal.     Palpations: Abdomen is soft.     Tenderness: There is no abdominal tenderness.  Musculoskeletal:        General: Normal range of motion.     Cervical back: Neck supple.  Skin:    General: Skin is warm and dry.  Neurological:     Mental Status: She is alert and oriented to person, place, and time.     Cranial Nerves: No cranial nerve deficit.     Deep Tendon Reflexes:     Reflex Scores:      Patellar reflexes are 2+ on the right side and 2+ on the left side. Psychiatric:        Mood and Affect: Mood normal.           Assessment & Plan:  Preventative health care Assessment & Plan: Declines tetanus, pneumonia, and shingrix vaccines.  Mammogram and bone density scan due in July, orders placed. Colonoscopy due now, referral placed to GI  Discussed the importance of a healthy diet and regular exercise in order for weight loss, and to reduce the risk of further co-morbidity.  Exam stable. Labs pending.  Follow up in 1 year for repeat physical.    Screening mammogram for breast cancer -     3D Screening Mammogram, Left and Right; Future  Estrogen deficiency -     DG Bone Density; Future  Primary hypertension Assessment & Plan: Stable.  Off spironolactone  4 days per patient preference.   Continue monitoring at home, discussed parameters.   Orders: -     Lipid panel -     Comprehensive metabolic panel with GFR  Screening for colon cancer -     Ambulatory referral to Gastroenterology  Vitamin D  deficiency Assessment & Plan: Repeat vitamin D  level pending.  Orders: -     VITAMIN D  25 Hydroxy (Vit-D Deficiency, Fractures)        Melody Prim K Shakeem Stern, NP

## 2023-11-23 NOTE — Patient Instructions (Signed)
Stop by the lab prior to leaving today. I will notify you of your results once received.   [facility] schedule your mammogram and bone density scan.   It was a pleasure to see you today!   

## 2023-11-23 NOTE — Assessment & Plan Note (Signed)
Repeat vitamin D level pending. 

## 2023-11-23 NOTE — Assessment & Plan Note (Signed)
 Declines tetanus, pneumonia, and shingrix vaccines.  Mammogram and bone density scan due in July, orders placed. Colonoscopy due now, referral placed to GI  Discussed the importance of a healthy diet and regular exercise in order for weight loss, and to reduce the risk of further co-morbidity.  Exam stable. Labs pending.  Follow up in 1 year for repeat physical.

## 2023-11-23 NOTE — Assessment & Plan Note (Signed)
 Stable.  Off spironolactone  4 days per patient preference.   Continue monitoring at home, discussed parameters.

## 2023-12-06 NOTE — Telephone Encounter (Signed)
 Patient to be contacted by our office administration.

## 2023-12-27 NOTE — Telephone Encounter (Signed)
 Left patient a vm to call the office and ask for Amy @ [phone]

## 2024-01-29 ENCOUNTER — Ambulatory Visit: Admitting: Dermatology

## 2024-03-04 DIAGNOSIS — Z1211 Encounter for screening for malignant neoplasm of colon: Secondary | ICD-10-CM

## 2024-04-08 ENCOUNTER — Ambulatory Visit: Admit: 2024-04-08 | Admitting: Gastroenterology

## 2024-04-08 SURGERY — COLONOSCOPY
Anesthesia: General

## 2024-04-14 ENCOUNTER — Other Ambulatory Visit: Payer: Self-pay | Admitting: Dermatology

## 2024-04-22 ENCOUNTER — Encounter: Payer: Self-pay | Admitting: Dermatology

## 2024-04-22 DIAGNOSIS — Z1211 Encounter for screening for malignant neoplasm of colon: Secondary | ICD-10-CM

## 2024-04-22 MED ORDER — SPIRONOLACTONE 50 MG PO TABS: 50.0000 mg | ORAL_TABLET | Freq: Every day | ORAL | 2 refills | Status: DC

## 2024-04-22 NOTE — Telephone Encounter (Signed)
 Pt can have 3 month refill.  Needs pt in feb 2026 before any additional can be given after that.  Thanks!

## 2024-07-17 DIAGNOSIS — L700 Acne vulgaris: Secondary | ICD-10-CM

## 2024-07-17 MED ORDER — SPIRONOLACTONE 50 MG PO TABS: 50.0000 mg | ORAL_TABLET | Freq: Every day | ORAL | 0 refills | Status: AC
